# Patient Record
Sex: Male | Born: 1990
Health system: Southern US, Academic
[De-identification: ages and names within clinical notes are randomized; demographics above are authoritative.]

## PROBLEM LIST (undated history)

## (undated) ENCOUNTER — Encounter

## (undated) DIAGNOSIS — K219 Gastro-esophageal reflux disease without esophagitis: Secondary | ICD-10-CM

## (undated) DIAGNOSIS — F32A Depression, unspecified: Secondary | ICD-10-CM

## (undated) DIAGNOSIS — F329 Major depressive disorder, single episode, unspecified: Secondary | ICD-10-CM

## (undated) DIAGNOSIS — M549 Dorsalgia, unspecified: Secondary | ICD-10-CM

## (undated) DIAGNOSIS — Q860 Fetal alcohol syndrome (dysmorphic): Secondary | ICD-10-CM

## (undated) DIAGNOSIS — F319 Bipolar disorder, unspecified: Secondary | ICD-10-CM

## (undated) DIAGNOSIS — F930 Separation anxiety disorder of childhood: Secondary | ICD-10-CM

## (undated) DIAGNOSIS — F909 Attention-deficit hyperactivity disorder, unspecified type: Secondary | ICD-10-CM

## (undated) HISTORY — DX: Gastro-esophageal reflux disease without esophagitis: K21.9

## (undated) HISTORY — PX: DENTAL SURGERY: SHX609

## (undated) HISTORY — DX: Fetal alcohol syndrome (dysmorphic): Q86.0

---
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
## 2012-08-05 ENCOUNTER — Emergency Department (HOSPITAL_COMMUNITY)
Admission: EM | Admit: 2012-08-05 | Discharge: 2012-08-05 | Disposition: A | Discharge status: Home / Self Care | Payer: Medicaid Other | Attending: Emergency Medicine | Admitting: Emergency Medicine

## 2012-08-05 ENCOUNTER — Emergency Department (HOSPITAL_COMMUNITY)
Admission: EM | Admit: 2012-08-05 | Discharge: 2012-08-05 | Discharge status: Against Medical Advice | Payer: Medicaid Other

## 2012-08-05 ENCOUNTER — Encounter (HOSPITAL_COMMUNITY): Payer: Self-pay | Admitting: Emergency Medicine

## 2012-08-05 DIAGNOSIS — F319 Bipolar disorder, unspecified: Secondary | ICD-10-CM | POA: Insufficient documentation

## 2012-08-05 DIAGNOSIS — S335XXA Sprain of ligaments of lumbar spine, initial encounter: Secondary | ICD-10-CM | POA: Insufficient documentation

## 2012-08-05 DIAGNOSIS — Y9389 Activity, other specified: Secondary | ICD-10-CM | POA: Insufficient documentation

## 2012-08-05 DIAGNOSIS — F17200 Nicotine dependence, unspecified, uncomplicated: Secondary | ICD-10-CM | POA: Insufficient documentation

## 2012-08-05 DIAGNOSIS — S39012A Strain of muscle, fascia and tendon of lower back, initial encounter: Secondary | ICD-10-CM

## 2012-08-05 DIAGNOSIS — S239XXA Sprain of unspecified parts of thorax, initial encounter: Secondary | ICD-10-CM | POA: Insufficient documentation

## 2012-08-05 DIAGNOSIS — F909 Attention-deficit hyperactivity disorder, unspecified type: Secondary | ICD-10-CM | POA: Insufficient documentation

## 2012-08-05 DIAGNOSIS — F411 Generalized anxiety disorder: Secondary | ICD-10-CM | POA: Insufficient documentation

## 2012-08-05 DIAGNOSIS — X503XXA Overexertion from repetitive movements, initial encounter: Secondary | ICD-10-CM | POA: Insufficient documentation

## 2012-08-05 DIAGNOSIS — Z79899 Other long term (current) drug therapy: Secondary | ICD-10-CM | POA: Insufficient documentation

## 2012-08-05 DIAGNOSIS — Y929 Unspecified place or not applicable: Secondary | ICD-10-CM | POA: Insufficient documentation

## 2012-08-05 HISTORY — DX: Dorsalgia, unspecified: M54.9

## 2012-08-05 HISTORY — DX: Attention-deficit hyperactivity disorder, unspecified type: F90.9

## 2012-08-05 HISTORY — DX: Bipolar disorder, unspecified: F31.9

## 2012-08-05 HISTORY — DX: Separation anxiety disorder of childhood: F93.0

## 2012-08-05 MED ORDER — OXYCODONE-ACETAMINOPHEN 5-325 MG PO TABS: 1.0000 | ORAL_TABLET | ORAL | Status: DC | PRN

## 2012-08-05 MED ORDER — IBUPROFEN 400 MG PO TABS
600.0000 mg | ORAL_TABLET | Freq: Once | ORAL | Status: AC
  Administered 2012-08-05: 600 mg via ORAL
  Filled 2012-08-05: qty 2

## 2012-08-05 MED ORDER — IBUPROFEN 600 MG PO TABS: 600.0000 mg | ORAL_TABLET | Freq: Four times a day (QID) | ORAL | Status: DC | PRN

## 2012-08-05 NOTE — ED Notes (Signed)
Patient states he has had problems with his back, but for the past 2 days, the pain has increased.  Patient states pain is radiating up to his shoulder blades.

## 2012-08-08 NOTE — ED Provider Notes (Signed)
CSN: [id number]     Arrival date & time 08/05/12  19 History    22 year old male with back pain. Gradual onset about 2 days ago. Progressively worsening. Patient relates a past history of back problems. Patient has been doing a lot of heavy lifting recently while helping somebody move. Pain is primarily in his mid back. It extends up into her shoulder blades and down into his lumbar region. Worse with movement. No acute numbness, tingling or loss of strength. No urinary complaints. Has been ambulating without much difficulty although the pain is somewhat increased with walking. Has tried taking ibuprofen with minimal relief.   First MD Initiated Contact with Patient 08/05/12 0153     Chief Complaint  Patient presents with  . Back Pain   (Consider location/radiation/quality/duration/timing/severity/associated sxs/prior Treatment) HPI  Past Medical History  Diagnosis Date  . Back pain   . ADHD (attention deficit hyperactivity disorder)   . Bipolar 1 disorder   . Separation anxiety    History reviewed. No pertinent past surgical history. No family history on file. History  Substance Use Topics  . Smoking status: Current Every Day Smoker -- 2.00 packs/day    Types: Cigarettes  . Smokeless tobacco: Not on file  . Alcohol Use: No    Review of Systems  All systems reviewed and negative, other than as noted in HPI.   Allergies  Review of patient's allergies indicates no known allergies.  Home Medications   Current Outpatient Rx  Name  Route  Sig  Dispense  Refill  . ALPRAZolam (XANAX) 1 MG tablet   Oral   Take 1 mg by mouth 3 (three) times daily as needed.         Marland Kitchen amphetamine-dextroamphetamine (ADDERALL XR) 20 MG 24 hr capsule   Oral   Take 20 mg by mouth every morning.         Marland Kitchen ibuprofen (ADVIL,MOTRIN) 600 MG tablet   Oral   Take 1 tablet (600 mg total) by mouth every 6 (six) hours as needed for pain.   30 tablet   0   . oxyCODONE-acetaminophen  (PERCOCET/ROXICET) 5-325 MG per tablet   Oral   Take 1 tablet by mouth every 4 (four) hours as needed for pain.   8 tablet   0   . zolpidem (AMBIEN) 10 MG tablet   Oral   Take 10 mg by mouth at bedtime as needed. For sleep          BP 131/71  Pulse 100  Temp(Src) 98.5 F (36.9 C) (Oral)  Resp 20  Ht 6' (1.829 m)  Wt 185 lb (83.915 kg)  BMI 25.08 kg/m2  SpO2 98% Physical Exam  Nursing note and vitals reviewed. Constitutional: He appears well-developed and well-nourished. No distress.  HENT:  Head: Normocephalic and atraumatic.  Eyes: Conjunctivae are normal. Right eye exhibits no discharge. Left eye exhibits no discharge.  Neck: Neck supple.  Cardiovascular: Normal rate, regular rhythm and normal heart sounds.  Exam reveals no gallop and no friction rub.   No murmur heard. Pulmonary/Chest: Effort normal and breath sounds normal. No respiratory distress.  Abdominal: Soft. He exhibits no distension. There is no tenderness.  Musculoskeletal: He exhibits no edema and no tenderness.  Tenderness along the paraspinal muscles in the mid/lower thoracic region as well as the lumbar region. No concerning skin lesions noted. Strength is 5 out 5 bilateral lower extremities. Patellar reflexes are one plus bilaterally. Sensation is intact to light touch. He has palpable  DP pulses bilaterally.  Neurological: He is alert.  Skin: Skin is warm and dry.  Psychiatric: He has a normal mood and affect. His behavior is normal. Thought content normal.    ED Course   Procedures (including critical care time)  Labs Reviewed - No data to display No results found. 1. Back strain, initial encounter     MDM  22 year old male with likely back strain. Plan symptomatic treatment. Nonfocal neurological examination.  Raeford Razor, MD 08/08/12 [phone]

## 2012-08-27 ENCOUNTER — Encounter (HOSPITAL_COMMUNITY): Payer: Self-pay | Admitting: *Deleted

## 2012-08-27 ENCOUNTER — Emergency Department (HOSPITAL_COMMUNITY): Payer: Medicaid Other

## 2012-08-27 ENCOUNTER — Emergency Department (HOSPITAL_COMMUNITY)
Admission: EM | Admit: 2012-08-27 | Discharge: 2012-08-27 | Disposition: A | Discharge status: Home / Self Care | Payer: Medicaid Other | Attending: Emergency Medicine | Admitting: Emergency Medicine

## 2012-08-27 DIAGNOSIS — M545 Low back pain, unspecified: Secondary | ICD-10-CM | POA: Insufficient documentation

## 2012-08-27 DIAGNOSIS — Z79899 Other long term (current) drug therapy: Secondary | ICD-10-CM | POA: Insufficient documentation

## 2012-08-27 DIAGNOSIS — R109 Unspecified abdominal pain: Secondary | ICD-10-CM

## 2012-08-27 DIAGNOSIS — F17200 Nicotine dependence, unspecified, uncomplicated: Secondary | ICD-10-CM | POA: Insufficient documentation

## 2012-08-27 DIAGNOSIS — R1011 Right upper quadrant pain: Secondary | ICD-10-CM | POA: Insufficient documentation

## 2012-08-27 DIAGNOSIS — Z8659 Personal history of other mental and behavioral disorders: Secondary | ICD-10-CM | POA: Insufficient documentation

## 2012-08-27 LAB — CBC WITH DIFFERENTIAL/PLATELET
Basophils Absolute: 0.1 10*3/uL (ref 0.0–0.1)
Basophils Relative: 1 % (ref 0–1)
Eosinophils Relative: 2 % (ref 0–5)
Lymphocytes Relative: 29 % (ref 12–46)
MCHC: 34 g/dL (ref 30.0–36.0)
MCV: 92.1 fL (ref 78.0–100.0)
Monocytes Absolute: 0.4 10*3/uL (ref 0.1–1.0)
Neutro Abs: 3.6 10*3/uL (ref 1.7–7.7)
Platelets: 254 10*3/uL (ref 150–400)
RDW: 12.4 % (ref 11.5–15.5)
WBC: 5.9 10*3/uL (ref 4.0–10.5)

## 2012-08-27 LAB — URINALYSIS, ROUTINE W REFLEX MICROSCOPIC
Bilirubin Urine: NEGATIVE
Glucose, UA: NEGATIVE mg/dL
Nitrite: NEGATIVE
Specific Gravity, Urine: 1.025 (ref 1.005–1.030)
pH: 6 (ref 5.0–8.0)

## 2012-08-27 LAB — COMPREHENSIVE METABOLIC PANEL
ALT: 14 U/L (ref 0–53)
AST: 16 U/L (ref 0–37)
Albumin: 3.7 g/dL (ref 3.5–5.2)
CO2: 31 mEq/L (ref 19–32)
Calcium: 10.1 mg/dL (ref 8.4–10.5)
Creatinine, Ser: 0.71 mg/dL (ref 0.50–1.35)
GFR calc non Af Amer: >90 mL/min (ref >90)
Sodium: 141 mEq/L (ref 135–145)
Total Protein: 7.2 g/dL (ref 6.0–8.3)

## 2012-08-27 LAB — LIPASE, BLOOD: Lipase: 30 U/L (ref 11–59)

## 2012-08-27 MED ORDER — RANITIDINE HCL 150 MG PO CAPS: 150.0000 mg | ORAL_CAPSULE | Freq: Every day | ORAL | Status: DC

## 2012-08-27 MED ORDER — HYDROMORPHONE HCL PF 1 MG/ML IJ SOLN
1.0000 mg | Freq: Once | INTRAMUSCULAR | Status: AC
  Administered 2012-08-27: 1 mg via INTRAVENOUS
  Filled 2012-08-27: qty 1

## 2012-08-27 MED ORDER — IOHEXOL 300 MG/ML  SOLN
50.0000 mL | Freq: Once | INTRAMUSCULAR | Status: AC | PRN
  Administered 2012-08-27: 50 mL via ORAL

## 2012-08-27 MED ORDER — TRAMADOL HCL 50 MG PO TABS: 50.0000 mg | ORAL_TABLET | Freq: Four times a day (QID) | ORAL | Status: DC | PRN

## 2012-08-27 MED ORDER — ONDANSETRON HCL 4 MG/2ML IJ SOLN
4.0000 mg | Freq: Once | INTRAMUSCULAR | Status: AC
  Administered 2012-08-27: 4 mg via INTRAVENOUS
  Filled 2012-08-27: qty 2

## 2012-08-27 MED ORDER — IOHEXOL 300 MG/ML  SOLN
100.0000 mL | Freq: Once | INTRAMUSCULAR | Status: AC | PRN
  Administered 2012-08-27: 100 mL via INTRAVENOUS

## 2012-08-27 NOTE — ED Notes (Signed)
Patient discharged to home. Verbalized understanding of discharge instructions. E-signature pad froze and would not work to obtain [employer]. Computer shut down and attempted to obtain E-signature from another computer, system stated e-signature was view only and would not let me obtain e-signature.

## 2012-08-27 NOTE — ED Notes (Signed)
R back pain ongoing x 1.5 months ago.  Seen here for same.  R abdominal pain now. No n/v/d.  Last NBM 15 mins. Ago.  No dysuria or hematuria.
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
## 2012-08-27 NOTE — ED Notes (Signed)
Dr. Estell Harpin in room at this time talking with patient about results.

## 2012-08-29 ENCOUNTER — Encounter (HOSPITAL_COMMUNITY): Payer: Self-pay | Admitting: Emergency Medicine

## 2012-08-29 ENCOUNTER — Emergency Department (HOSPITAL_COMMUNITY): Payer: Medicaid Other

## 2012-08-29 ENCOUNTER — Emergency Department (HOSPITAL_COMMUNITY)
Admission: EM | Admit: 2012-08-29 | Discharge: 2012-08-29 | Disposition: A | Discharge status: Home / Self Care | Payer: Medicaid Other | Attending: Emergency Medicine | Admitting: Emergency Medicine

## 2012-08-29 DIAGNOSIS — Z79899 Other long term (current) drug therapy: Secondary | ICD-10-CM | POA: Insufficient documentation

## 2012-08-29 DIAGNOSIS — F17200 Nicotine dependence, unspecified, uncomplicated: Secondary | ICD-10-CM | POA: Insufficient documentation

## 2012-08-29 DIAGNOSIS — Z8659 Personal history of other mental and behavioral disorders: Secondary | ICD-10-CM | POA: Insufficient documentation

## 2012-08-29 DIAGNOSIS — R0789 Other chest pain: Secondary | ICD-10-CM

## 2012-08-29 DIAGNOSIS — M542 Cervicalgia: Secondary | ICD-10-CM

## 2012-08-29 DIAGNOSIS — S298XXA Other specified injuries of thorax, initial encounter: Secondary | ICD-10-CM | POA: Insufficient documentation

## 2012-08-29 DIAGNOSIS — S0993XA Unspecified injury of face, initial encounter: Secondary | ICD-10-CM | POA: Insufficient documentation

## 2012-08-29 DIAGNOSIS — F930 Separation anxiety disorder of childhood: Secondary | ICD-10-CM | POA: Insufficient documentation

## 2012-08-29 MED ORDER — IBUPROFEN 800 MG PO TABS
800.0000 mg | ORAL_TABLET | Freq: Once | ORAL | Status: AC
  Administered 2012-08-29: 800 mg via ORAL
  Filled 2012-08-29: qty 1
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
## 2013-05-05 DIAGNOSIS — K047 Periapical abscess without sinus: Secondary | ICD-10-CM | POA: Insufficient documentation

## 2013-05-05 DIAGNOSIS — K029 Dental caries, unspecified: Secondary | ICD-10-CM | POA: Insufficient documentation

## 2013-05-05 DIAGNOSIS — F17200 Nicotine dependence, unspecified, uncomplicated: Secondary | ICD-10-CM | POA: Insufficient documentation

## 2013-05-05 DIAGNOSIS — Z8659 Personal history of other mental and behavioral disorders: Secondary | ICD-10-CM | POA: Insufficient documentation

## 2013-05-05 DIAGNOSIS — R599 Enlarged lymph nodes, unspecified: Secondary | ICD-10-CM | POA: Insufficient documentation

## 2013-05-06 ENCOUNTER — Encounter (HOSPITAL_COMMUNITY): Payer: Self-pay | Admitting: Emergency Medicine

## 2013-05-06 ENCOUNTER — Emergency Department (HOSPITAL_COMMUNITY)
Admission: EM | Admit: 2013-05-06 | Discharge: 2013-05-06 | Disposition: A | Discharge status: Home / Self Care | Payer: Medicaid Other | Attending: Emergency Medicine | Admitting: Emergency Medicine

## 2013-05-06 DIAGNOSIS — K047 Periapical abscess without sinus: Secondary | ICD-10-CM

## 2013-05-06 DIAGNOSIS — K029 Dental caries, unspecified: Secondary | ICD-10-CM

## 2013-05-06 MED ORDER — OXYCODONE-ACETAMINOPHEN 5-325 MG PO TABS
1.0000 | ORAL_TABLET | Freq: Once | ORAL | Status: AC
  Administered 2013-05-06: 1 via ORAL
  Filled 2013-05-06: qty 1

## 2013-05-06 MED ORDER — AMOXICILLIN 500 MG PO CAPS: 500.0000 mg | ORAL_CAPSULE | Freq: Three times a day (TID) | ORAL | Status: DC

## 2013-05-06 MED ORDER — HYDROCODONE-ACETAMINOPHEN 5-325 MG PO TABS: 1.0000 | ORAL_TABLET | ORAL | Status: DC | PRN

## 2013-05-06 MED ORDER — AMOXICILLIN 250 MG PO CAPS
500.0000 mg | ORAL_CAPSULE | Freq: Once | ORAL | Status: AC
  Administered 2013-05-06: 500 mg via ORAL
  Filled 2013-05-06: qty 2

## 2013-05-06 MED ORDER — IBUPROFEN 800 MG PO TABS
800.0000 mg | ORAL_TABLET | Freq: Once | ORAL | Status: AC
  Administered 2013-05-06: 800 mg via ORAL
  Filled 2013-05-06: qty 1

## 2013-05-06 NOTE — ED Notes (Signed)
Pt reports dental pain lower right.  States that he is supposed to see a Education officer, communitydentist but is waiting on his medicaid card.

## 2013-05-06 NOTE — Discharge Instructions (Signed)
Take the medication as directed. Do not take the narcotic if you area driving as it will make you sleepy. See a dentist as soon as possible.

## 2013-05-06 NOTE — ED Provider Notes (Signed)
CSN: [id number]     Arrival date & time 05/05/13  2335 History   First MD Initiated Contact with Patient 05/06/13 0009     Chief Complaint  Patient presents with  . Dental Pain     (Consider location/radiation/quality/duration/timing/severity/associated sxs/prior Treatment) Patient is a 23 y.o. male presenting with tooth pain. The history is provided by the patient.  Dental Pain Location:  Lower Lower teeth location:  17/LL 3rd molar Quality:  Sharp, shooting, throbbing and constant Onset quality:  Gradual Duration:  3 months Timing:  Constant Progression:  Worsening Context: abscess   Relieved by:  Nothing Worsened by:  Cold food/drink, jaw movement and pressure Ineffective treatments:  None tried Associated symptoms: facial pain, facial swelling and gum swelling   Associated symptoms: no fever, no headaches and no oral bleeding     Past Medical History  Diagnosis Date  . Back pain   . ADHD (attention deficit hyperactivity disorder)   . Bipolar 1 disorder   . Separation anxiety    History reviewed. No pertinent past surgical history. History reviewed. No pertinent family history. History  Substance Use Topics  . Smoking status: Current Every Day Smoker -- 1.00 packs/day    Types: Cigarettes  . Smokeless tobacco: Not on file  . Alcohol Use: No    Review of Systems  Constitutional: Negative for fever and chills.  HENT: Positive for dental problem and facial swelling. Negative for sore throat and trouble swallowing.   Respiratory: Negative for shortness of breath.   Cardiovascular: Negative for chest pain.  Gastrointestinal: Negative for nausea, vomiting and abdominal pain.  Musculoskeletal: Negative for back pain.  Skin: Negative for rash.  Neurological: Negative for seizures, syncope and headaches.  Psychiatric/Behavioral: The patient is not nervous/anxious.       Allergies  Review of patient's allergies indicates no known allergies.  Home Medications    Prior to Admission medications   Medication Sig Start Date End Date Taking? Authorizing Provider  ranitidine (ZANTAC) 150 MG capsule Take 1 capsule (150 mg total) by mouth daily. 08/27/12   Benny LennertJoseph L Zammit, MD  traMADol (ULTRAM) 50 MG tablet Take 1 tablet (50 mg total) by mouth every 6 (six) hours as needed for pain. 08/27/12   Benny LennertJoseph L Zammit, MD   BP 131/80  Pulse 84  Temp(Src) 98.5 F (36.9 C) (Oral)  Resp 18  Ht 6\' 2"  (1.88 m)  Wt 171 lb (77.565 kg)  BMI 21.95 kg/m2  SpO2 99% Physical Exam  Nursing note and vitals reviewed. Constitutional: He is oriented to person, place, and time. He appears well-developed and well-nourished. No distress.  HENT:  Mouth/Throat: Uvula is midline, oropharynx is clear and moist and mucous membranes are normal. No oral lesions. No trismus in the jaw. Dental abscesses and dental caries present. No uvula swelling.    There is swelling, erythema and tenderness surrounding the third molar right lower. The tooth is decayed. Left side facial swelling and tenderness of the left jaw.   Eyes: Conjunctivae and EOM are normal.  Neck: Neck supple.  Cardiovascular: Normal rate.   Pulmonary/Chest: Effort normal.  Musculoskeletal: Normal range of motion.  Lymphadenopathy:    He has cervical adenopathy.  Neurological: He is alert and oriented to person, place, and time. No cranial nerve deficit.  Skin: Skin is warm and dry.  Psychiatric: He has a normal mood and affect. His behavior is normal.    ED Course  Procedures Amoxil 500 mg PO, Percocet 5/325 PO, Ibuprofen 800  mg PO  MDM  23 y.o. male with dental pain and abscess. Will treat with antibiotics and pain medication. He will follow up with a dentist. He has applied for Medicaid and accepted and the dentist has agreed to see him when he has his card. Stable for discharge without fever or signs of sepsis. Discussed with the patient and all questioned fully answered. He will return if any problems arise.     Medication List    TAKE these medications       amoxicillin 500 MG capsule  Commonly known as:  AMOXIL  Take 1 capsule (500 mg total) by mouth 3 (three) times daily.     HYDROcodone-acetaminophen 5-325 MG per tablet  Commonly known as:  NORCO/VICODIN  Take 1 tablet by mouth every 4 (four) hours as needed.      ASK your doctor about these medications       ranitidine 150 MG capsule  Commonly known as:  ZANTAC  Take 1 capsule (150 mg total) by mouth daily.     traMADol 50 MG tablet  Commonly known as:  ULTRAM  Take 1 tablet (50 mg total) by mouth every 6 (six) hours as needed for pain.           [facility] Orlene OchM Naiya Corral, [place]NP 05/06/13 [phone]

## 2013-05-06 NOTE — ED Provider Notes (Signed)
Medical screening examination/treatment/procedure(s) were performed by non-physician practitioner and as supervising physician I was immediately available for consultation/collaboration.   Dione Boozeavid Rolin Schult, MD 05/06/13 [phone]

## 2013-06-18 ENCOUNTER — Encounter (HOSPITAL_COMMUNITY): Payer: Self-pay | Admitting: Emergency Medicine

## 2013-06-18 ENCOUNTER — Emergency Department (HOSPITAL_COMMUNITY)
Admission: EM | Admit: 2013-06-18 | Discharge: 2013-06-18 | Disposition: A | Discharge status: Home / Self Care | Payer: Medicaid Other | Attending: Emergency Medicine | Admitting: Emergency Medicine

## 2013-06-18 DIAGNOSIS — F17200 Nicotine dependence, unspecified, uncomplicated: Secondary | ICD-10-CM | POA: Insufficient documentation

## 2013-06-18 DIAGNOSIS — K047 Periapical abscess without sinus: Secondary | ICD-10-CM | POA: Insufficient documentation

## 2013-06-18 DIAGNOSIS — K029 Dental caries, unspecified: Secondary | ICD-10-CM | POA: Insufficient documentation

## 2013-06-18 DIAGNOSIS — Z79899 Other long term (current) drug therapy: Secondary | ICD-10-CM | POA: Insufficient documentation

## 2013-06-18 DIAGNOSIS — Z792 Long term (current) use of antibiotics: Secondary | ICD-10-CM | POA: Insufficient documentation

## 2013-06-18 DIAGNOSIS — Z8659 Personal history of other mental and behavioral disorders: Secondary | ICD-10-CM | POA: Insufficient documentation

## 2013-06-18 MED ORDER — HYDROCODONE-ACETAMINOPHEN 5-325 MG PO TABS: 1.0000 | ORAL_TABLET | ORAL | Status: DC | PRN

## 2013-06-18 MED ORDER — AMOXICILLIN 500 MG PO CAPS: 500.0000 mg | ORAL_CAPSULE | Freq: Three times a day (TID) | ORAL | Status: DC

## 2013-06-18 NOTE — ED Provider Notes (Signed)
CSN: [id number]     Arrival date & time 06/18/13  2010 History   First MD Initiated Contact with Patient 06/18/13 2057     Chief Complaint  Patient presents with  . Dental Pain     (Consider location/radiation/quality/duration/timing/severity/associated sxs/prior Treatment) The history is provided by the patient.   John Saunders is a 23 y.o. male presenting with a 2 day history of dental pain and gingival swelling.   The patient has a history of decay and pain involving his bilateral lower 3rd molars which has recently started to cause increased  pain.  There has been no fevers, chills, nausea or vomiting, also no complaint of difficulty swallowing, although chewing makes pain worse.  [place] dentist in [place] and [place] [facility] [place] [place] [facility] [facility] [facility] [facility].  The patient has tried ibuprofen milligram without relief of symptoms.     Past Medical History  Diagnosis Date  . Back pain   . ADHD (attention deficit hyperactivity disorder)   . Bipolar 1 disorder   . Separation anxiety    History reviewed. No pertinent past surgical history. History reviewed. No pertinent family history. History  Substance Use Topics  . Smoking status: Current Every Day Smoker -- 1.00 packs/day    Types: Cigarettes  . Smokeless tobacco: Not on file  . Alcohol Use: No    Review of Systems  Constitutional: Negative for fever.  HENT: Positive for dental problem. Negative for facial swelling and sore throat.   Respiratory: Negative for shortness of breath.   Musculoskeletal: Negative for neck pain and neck stiffness.      Allergies  Review of patient's allergies indicates no known allergies.  Home Medications   Prior to Admission medications   Medication Sig Start Date End Date Taking? Authorizing Aleric Froelich  amoxicillin (AMOXIL) 500 MG capsule Take 1 capsule (500 mg total) by mouth 3 (three) times daily. 05/06/13   Dione Boozeavid Glick, MD   HYDROcodone-acetaminophen (NORCO/VICODIN) 5-325 MG per tablet Take 1 tablet by mouth every 4 (four) hours as needed. 05/06/13   Dione Boozeavid Glick, MD  ranitidine (ZANTAC) 150 MG capsule Take 1 capsule (150 mg total) by mouth daily. 08/27/12   Benny LennertJoseph L Zammit, MD  traMADol (ULTRAM) 50 MG tablet Take 1 tablet (50 mg total) by mouth every 6 (six) hours as needed for pain. 08/27/12   Benny LennertJoseph L Zammit, MD   BP 156/76  Pulse 65  Temp(Src) 98.2 F (36.8 C) (Oral)  Resp 24  Ht 6\' 2"  (1.88 m)  Wt 175 lb (79.379 kg)  BMI 22.46 kg/m2  SpO2 100% Physical Exam  Constitutional: He is oriented to person, place, and time. He appears well-developed and well-nourished. No distress.  HENT:  Head: Normocephalic and atraumatic.  Right Ear: Tympanic membrane and external ear normal.  Left Ear: Tympanic membrane and external ear normal.  Mouth/Throat: Oropharynx is clear and moist and mucous membranes are normal. No oral lesions. No trismus in the jaw. Dental abscesses and dental caries present.  Decay found in the occlusal surfaces of both lower third molar teeth.  His right lower molar also has surrounding gingival edema and erythema.  There is no fluctuance.  There is no facial edema.  Eyes: Conjunctivae are normal.  Neck: Normal range of motion. Neck supple.  Cardiovascular: Normal rate and normal heart sounds.   Pulmonary/Chest: Effort normal.  Abdominal: He exhibits no distension.  Musculoskeletal: Normal range of motion.  Lymphadenopathy:  Head (right side): Submandibular adenopathy present.    He has no cervical adenopathy.  Neurological: He is alert and oriented to person, place, and time.  Skin: Skin is warm and dry. No erythema.  Psychiatric: He has a normal mood and affect.    ED Course  Procedures (including critical care time) Labs Review Labs Reviewed - No data to display  Imaging Review No results found.   EKG Interpretation None      MDM   Final diagnoses:  Dental abscess     Patient will be prescribed amoxicillin.  Hydrocodone.  Ibuprofen 600 mg tablets.  Strongly encouraged patient to keep his appointment with Dr. Lovell SheehanJenkins on July 3.    Burgess AmorJulie Idol, PA-C 06/18/13 2110

## 2013-06-18 NOTE — Discharge Instructions (Signed)
Abscessed Tooth An abscessed tooth is an infection around your tooth. It may be caused by holes or damage to the tooth (cavity) or a dental disease. An abscessed tooth causes mild to very bad pain in and around the tooth. See your dentist right away if you have tooth or gum pain. HOME CARE  Take your medicine as told. Finish it even if you start to feel better.  Do not drive after taking pain medicine.  Rinse your mouth (gargle) often with salt water ( teaspoon salt in 8 ounces of warm water).  Do not apply heat to the outside of your face. GET HELP RIGHT AWAY IF:   You have a temperature by mouth above 102 F (38.9 C), not controlled by medicine.  You have chills and a very bad headache.  You have problems breathing or swallowing.  Your mouth will not open.  You develop puffiness (swelling) on the neck or around the eye.  Your pain is not helped by medicine.  Your pain is getting worse instead of better. MAKE SURE YOU:   Understand these instructions.  Will watch your condition.  Will get help right away if you are not doing well or get worse. Document Released: 06/07/2007 Document Revised: 03/13/2011 Document Reviewed: 03/29/2010 [facility] Patient Information 2015 [employer] [place] [place]. This information is not intended to replace advice given to you by your health care provider. Make sure you discuss any questions you have with your health care provider.    Complete your entire course of antibiotics as prescribed.  You  may use the hydrocodone for pain relief but do not drive within 4 hours of taking as this will make you drowsy.  Avoid applying heat or ice to this abscess area which can worsen your symptoms.  You may use warm salt water swish and spit treatment or half peroxide and water swish and spit after meals to keep this area clean as discussed.  Call the dentist listed above for further management of your symptoms.

## 2013-06-18 NOTE — ED Notes (Signed)
Patient reports dental pain x 2 days.

## 2013-06-19 NOTE — ED Provider Notes (Signed)
Medical screening examination/treatment/procedure(s) were performed by non-physician practitioner and as supervising physician I was immediately available for consultation/collaboration.   EKG Interpretation None        Stephen Rancour, MD 06/19/13 0045 

## 2013-07-23 ENCOUNTER — Encounter (HOSPITAL_COMMUNITY): Payer: Self-pay | Admitting: Emergency Medicine

## 2013-07-23 ENCOUNTER — Emergency Department (HOSPITAL_COMMUNITY)
Admission: EM | Admit: 2013-07-23 | Discharge: 2013-07-23 | Disposition: A | Discharge status: Home / Self Care | Payer: Medicaid Other | Attending: Emergency Medicine | Admitting: Emergency Medicine

## 2013-07-23 DIAGNOSIS — F17200 Nicotine dependence, unspecified, uncomplicated: Secondary | ICD-10-CM | POA: Insufficient documentation

## 2013-07-23 DIAGNOSIS — F930 Separation anxiety disorder of childhood: Secondary | ICD-10-CM | POA: Insufficient documentation

## 2013-07-23 DIAGNOSIS — Z791 Long term (current) use of non-steroidal anti-inflammatories (NSAID): Secondary | ICD-10-CM | POA: Insufficient documentation

## 2013-07-23 DIAGNOSIS — K029 Dental caries, unspecified: Secondary | ICD-10-CM | POA: Insufficient documentation

## 2013-07-23 DIAGNOSIS — K047 Periapical abscess without sinus: Secondary | ICD-10-CM

## 2013-07-23 DIAGNOSIS — K089 Disorder of teeth and supporting structures, unspecified: Secondary | ICD-10-CM | POA: Diagnosis present

## 2013-07-23 DIAGNOSIS — K044 Acute apical periodontitis of pulpal origin: Secondary | ICD-10-CM | POA: Insufficient documentation

## 2013-07-23 MED ORDER — TRAMADOL HCL 50 MG PO TABS: 50.0000 mg | ORAL_TABLET | Freq: Four times a day (QID) | ORAL | Status: DC | PRN

## 2013-07-23 MED ORDER — TRAMADOL HCL 50 MG PO TABS
50.0000 mg | ORAL_TABLET | Freq: Once | ORAL | Status: AC
  Administered 2013-07-23: 50 mg via ORAL
  Filled 2013-07-23: qty 1

## 2013-07-23 MED ORDER — AMOXICILLIN 500 MG PO CAPS: 500.0000 mg | ORAL_CAPSULE | Freq: Three times a day (TID) | ORAL | Status: AC

## 2013-07-23 MED ORDER — AMOXICILLIN 250 MG PO CAPS
500.0000 mg | ORAL_CAPSULE | Freq: Once | ORAL | Status: AC
  Administered 2013-07-23: 500 mg via ORAL
  Filled 2013-07-23: qty 2

## 2013-07-23 NOTE — ED Notes (Signed)
Dental pain left side

## 2013-07-23 NOTE — ED Provider Notes (Signed)
Medical screening examination/treatment/procedure(s) were performed by non-physician practitioner and as supervising physician I was immediately available for consultation/collaboration.   EKG Interpretation None        Vanetta MuldersScott Deylan Canterbury, MD 07/23/13 [phone]

## 2013-07-23 NOTE — ED Provider Notes (Signed)
CSN: [id number]     Arrival date & time 07/23/13  1307 History   First MD Initiated Contact with Patient 07/23/13 1423     Chief Complaint  Patient presents with  . Dental Pain     (Consider location/radiation/quality/duration/timing/severity/associated sxs/prior Treatment) The history is provided by the patient.    John Saunders is a 23 y.o. male presenting with persistent dental pain in his 4 wisdom teeth with the lower hurting more than the upper teeth.  He was originally scheduled for dental extraction on July 3 with Dr. Barbette Merino which had to be rescheduled for August 17.  He reports worsened pain and gingival swelling for the past several days.  There has been no fevers, chills, nausea or vomiting, also no complaint of difficulty swallowing, although chewing makes pain worse.  The patient has tried tylenol and motrin without relief of symptoms.         Past Medical History  Diagnosis Date  . Back pain   . ADHD (attention deficit hyperactivity disorder)   . Bipolar 1 disorder   . Separation anxiety    History reviewed. No pertinent past surgical history. No family history on file. History  Substance Use Topics  . Smoking status: Current Every Day Smoker -- 1.00 packs/day    Types: Cigarettes  . Smokeless tobacco: Not on file  . Alcohol Use: No    Review of Systems  Constitutional: Negative for fever.  HENT: Positive for dental problem. Negative for facial swelling and sore throat.   Respiratory: Negative for shortness of breath.   Musculoskeletal: Negative for neck pain and neck stiffness.      Allergies  Review of patient's allergies indicates no known allergies.  Home Medications   Prior to Admission medications   Medication Sig Start Date End Date Taking? Authorizing Provider  ibuprofen (ADVIL,MOTRIN) 200 MG tablet Take 400 mg by mouth every 6 (six) hours as needed. pain   Yes Historical Provider, MD  amoxicillin (AMOXIL) 500 MG capsule Take 1 capsule (500 mg  total) by mouth 3 (three) times daily. 07/23/13 08/02/13  Burgess Amor, PA-C  traMADol (ULTRAM) 50 MG tablet Take 1 tablet (50 mg total) by mouth every 6 (six) hours as needed. 07/23/13   Burgess Amor, PA-C   BP 128/84  Pulse 96  Temp(Src) 98.7 F (37.1 C) (Oral)  Resp 18  Ht 6\' 2"  (1.88 m)  Wt 175 lb (79.379 kg)  BMI 22.46 kg/m2  SpO2 98% Physical Exam  Constitutional: He is oriented to person, place, and time. He appears well-developed and well-nourished. No distress.  HENT:  Head: Normocephalic and atraumatic.  Right Ear: Tympanic membrane and external ear normal.  Left Ear: Tympanic membrane and external ear normal.  Mouth/Throat: Oropharynx is clear and moist and mucous membranes are normal. No oral lesions. Abnormal dentition. Dental caries present. No dental abscesses.    Gingival edema bilateral lower third molars.  No fluctuance or evidence of abscess.  Deep cavity noted in right lower third molar  Eyes: Conjunctivae are normal.  Neck: Normal range of motion. Neck supple.  Cardiovascular: Normal rate and normal heart sounds.   Pulmonary/Chest: Effort normal.  Abdominal: He exhibits no distension.  Musculoskeletal: Normal range of motion.  Lymphadenopathy:    He has no cervical adenopathy.  Neurological: He is alert and oriented to person, place, and time.  Skin: Skin is warm and dry. No erythema.  Psychiatric: He has a normal mood and affect.    ED Course  Procedures (including critical care time) Labs Review Labs Reviewed - No data to display  Imaging Review No results found.   EKG Interpretation None      MDM   Final diagnoses:  Dental infection    Patient does appear to have early dental infection without obvious abscess identified.  He was prescribed amoxicillin and Ultram.  He was encouraged to keep his appointment with Dr. Barbette MerinoJensen on August 17 as he has already scheduled for wisdom tooth extraction.    Burgess AmorJulie Greenly Rarick, PA-C 07/23/13 1443

## 2013-07-23 NOTE — ED Notes (Signed)
Pt c/o left side dental pain x2 days. Denies drainage/bleeding.

## 2013-07-23 NOTE — Discharge Instructions (Signed)

## 2014-12-02 ENCOUNTER — Emergency Department (HOSPITAL_COMMUNITY)
Admission: EM | Admit: 2014-12-02 | Discharge: 2014-12-03 | Disposition: A | Discharge status: Home / Self Care | Payer: Medicaid Other | Attending: Emergency Medicine | Admitting: Emergency Medicine

## 2014-12-02 ENCOUNTER — Encounter (HOSPITAL_COMMUNITY): Payer: Self-pay | Admitting: Emergency Medicine

## 2014-12-02 DIAGNOSIS — Z8659 Personal history of other mental and behavioral disorders: Secondary | ICD-10-CM | POA: Insufficient documentation

## 2014-12-02 DIAGNOSIS — R112 Nausea with vomiting, unspecified: Secondary | ICD-10-CM | POA: Insufficient documentation

## 2014-12-02 DIAGNOSIS — R1013 Epigastric pain: Secondary | ICD-10-CM | POA: Diagnosis not present

## 2014-12-02 DIAGNOSIS — Z79899 Other long term (current) drug therapy: Secondary | ICD-10-CM | POA: Insufficient documentation

## 2014-12-02 DIAGNOSIS — F17210 Nicotine dependence, cigarettes, uncomplicated: Secondary | ICD-10-CM | POA: Diagnosis not present

## 2014-12-02 DIAGNOSIS — R1033 Periumbilical pain: Secondary | ICD-10-CM | POA: Insufficient documentation

## 2014-12-02 NOTE — ED Notes (Signed)
Pt c/o severe sharp abd pain x 10 minutes. Pt denies any n/v/d.

## 2014-12-03 ENCOUNTER — Emergency Department (HOSPITAL_COMMUNITY): Payer: Medicaid Other

## 2014-12-03 LAB — COMPREHENSIVE METABOLIC PANEL
ALBUMIN: 4 g/dL (ref 3.5–5.0)
ALK PHOS: 62 U/L (ref 38–126)
ALT: 15 U/L — ABNORMAL LOW (ref 17–63)
ANION GAP: 10 (ref 5–15)
AST: 16 U/L (ref 15–41)
BILIRUBIN TOTAL: 0.7 mg/dL (ref 0.3–1.2)
BUN: 12 mg/dL (ref 6–20)
CALCIUM: 9.6 mg/dL (ref 8.9–10.3)
CO2: 27 mmol/L (ref 22–32)
Chloride: 103 mmol/L (ref 101–111)
Creatinine, Ser: 0.88 mg/dL (ref 0.61–1.24)
GFR calc Af Amer: >60 mL/min (ref >60)
GFR calc non Af Amer: >60 mL/min (ref >60)
GLUCOSE: 110 mg/dL — AB (ref 65–99)
POTASSIUM: 3.4 mmol/L — AB (ref 3.5–5.1)
SODIUM: 140 mmol/L (ref 135–145)
TOTAL PROTEIN: 7.2 g/dL (ref 6.5–8.1)

## 2014-12-03 LAB — CBC WITH DIFFERENTIAL/PLATELET
Basophils Absolute: 0.1 10*3/uL (ref 0.0–0.1)
Basophils Relative: 1 %
EOS PCT: 8 %
Eosinophils Absolute: 0.7 10*3/uL (ref 0.0–0.7)
HEMATOCRIT: 41.9 % (ref 39.0–52.0)
Hemoglobin: 14.7 g/dL (ref 13.0–17.0)
LYMPHS PCT: 25 %
Lymphs Abs: 2.3 10*3/uL (ref 0.7–4.0)
MCH: 31.6 pg (ref 26.0–34.0)
MCHC: 35.1 g/dL (ref 30.0–36.0)
MCV: 90.1 fL (ref 78.0–100.0)
MONO ABS: 0.7 10*3/uL (ref 0.1–1.0)
MONOS PCT: 7 %
NEUTROS ABS: 5.3 10*3/uL (ref 1.7–7.7)
Neutrophils Relative %: 59 %
PLATELETS: 276 10*3/uL (ref 150–400)
RBC: 4.65 MIL/uL (ref 4.22–5.81)
RDW: 12.5 % (ref 11.5–15.5)
WBC: 9.1 10*3/uL (ref 4.0–10.5)

## 2014-12-03 LAB — URINALYSIS, ROUTINE W REFLEX MICROSCOPIC
Bilirubin Urine: NEGATIVE
GLUCOSE, UA: NEGATIVE mg/dL
Hgb urine dipstick: NEGATIVE
KETONES UR: NEGATIVE mg/dL
Leukocytes, UA: NEGATIVE
Nitrite: NEGATIVE
PH: 6 (ref 5.0–8.0)
PROTEIN: NEGATIVE mg/dL
Specific Gravity, Urine: 1.025 (ref 1.005–1.030)

## 2014-12-03 MED ORDER — IOHEXOL 300 MG/ML  SOLN
100.0000 mL | Freq: Once | INTRAMUSCULAR | Status: AC | PRN
  Administered 2014-12-03: 100 mL via INTRAVENOUS

## 2014-12-03 MED ORDER — ONDANSETRON HCL 4 MG/2ML IJ SOLN
4.0000 mg | Freq: Once | INTRAMUSCULAR | Status: AC
  Administered 2014-12-03: 4 mg via INTRAVENOUS
  Filled 2014-12-03: qty 2

## 2014-12-03 MED ORDER — FENTANYL CITRATE (PF) 100 MCG/2ML IJ SOLN
50.0000 ug | Freq: Once | INTRAMUSCULAR | Status: AC
  Administered 2014-12-03: 50 ug via INTRAVENOUS
  Filled 2014-12-03: qty 2

## 2014-12-03 MED ORDER — SODIUM CHLORIDE 0.9 % IV SOLN: 1000.0000 mL | INTRAVENOUS | Status: DC

## 2014-12-03 MED ORDER — BARIUM SULFATE 2 % PO SUSP
450.0000 mL | Freq: Once | ORAL | Status: DC
  Filled 2014-12-03: qty 450

## 2014-12-03 MED ORDER — SODIUM CHLORIDE 0.9 % IV SOLN
1000.0000 mL | Freq: Once | INTRAVENOUS | Status: AC
  Administered 2014-12-03: 1000 mL via INTRAVENOUS

## 2014-12-03 MED ORDER — OMEPRAZOLE 20 MG PO CPDR: DELAYED_RELEASE_CAPSULE | ORAL | Status: DC

## 2014-12-03 MED ORDER — DICYCLOMINE HCL 20 MG PO TABS: 20.0000 mg | ORAL_TABLET | Freq: Three times a day (TID) | ORAL | Status: DC

## 2014-12-03 NOTE — ED Provider Notes (Signed)
CSN: [id number]   Arrival date & time 12/02/14 2343  History  By signing my name below, I, John Saunders, attest that this documentation has been prepared under the direction and in the presence of John Albe, MD at 0030. Electronically Signed: Bethel Saunders, ED Scribe. 12/03/2014. 3:25 AM.  Chief Complaint  Patient presents with  . Abdominal Pain    HPI The history is provided by the patient. No language interpreter was used.   John Saunders is a 24 y.o. male who presents to the Emergency Department complaining of recurrent, sharp, constant, non-radiating  periumbilical abdominal pain with sudden onset 10 minutes PTA while laying down. Applying pressure to the abdomen variably improves the pain and nothing makes it worse. The pain started 2 hours after eating spaghetti.  He had 2 similar episodes (last week and the week before) that lasted for 1 hour and resolved spontaneously. He did not seek medical attention the first 2 times.  Associated symptoms include nausea and 2 episodes of emesis tonight. Pt denies diarrhea, dysuria, and hematuria.    PCP [facility] Department   Past Medical History  Diagnosis Date  . Back pain   . ADHD (attention deficit hyperactivity disorder)   . Bipolar 1 disorder (HCC)   . Separation anxiety     History reviewed. No pertinent past surgical history.  History reviewed. No pertinent family history.  Social History  Substance Use Topics  . Smoking status: Current Every Day Smoker -- 1.00 packs/day    Types: Cigarettes  . Smokeless tobacco: None  . Alcohol Use: No   on disability for ADHD, bipolar and fetal alcohol syndrome  Review of Systems  Gastrointestinal: Positive for nausea, vomiting and abdominal pain. Negative for diarrhea.  Genitourinary: Negative for dysuria and hematuria.  All other systems reviewed and are negative.  Home Medications   Prior to Admission medications   Medication Sig Start Date End Date Taking?  Authorizing Provider  dicyclomine (BENTYL) 20 MG tablet Take 1 tablet (20 mg total) by mouth 4 (four) times daily -  before meals and at bedtime. 12/03/14   John Albe, MD  ibuprofen (ADVIL,MOTRIN) 200 MG tablet Take 400 mg by mouth every 6 (six) hours as needed. pain    Historical Provider, MD  omeprazole (PRILOSEC) 20 MG capsule Take 1 po BID x 2 weeks then once a day 12/03/14   John Albe, MD  traMADol (ULTRAM) 50 MG tablet Take 1 tablet (50 mg total) by mouth every 6 (six) hours as needed. 07/23/13   Burgess Amor, PA-C    Allergies  Review of patient's allergies indicates no known allergies.  Triage Vitals: BP 135/77 mmHg  Pulse 73  Temp(Src) 98.2 F (36.8 C)  Resp 20  Ht  (1.88 m)  Wt 175 lb (79.379 kg)  BMI 22.46 kg/m2  SpO2 95%  Vital signs normal    Physical Exam  Constitutional: He is oriented to person, place, and time. He appears well-developed and well-nourished.  Non-toxic appearance. He does not appear ill. He appears distressed.  Tearful  HENT:  Head: Normocephalic and atraumatic.  Right Ear: External ear normal.  Left Ear: External ear normal.  Nose: Nose normal. No mucosal edema or rhinorrhea.  Mouth/Throat: Oropharynx is clear and moist and mucous membranes are normal. No dental abscesses or uvula swelling.  Eyes: Conjunctivae and EOM are normal. Pupils are equal, round, and reactive to light.  Neck: Normal range of motion and full passive range of motion without  pain. Neck supple.  Cardiovascular: Normal rate, regular rhythm and normal heart sounds.  Exam reveals no gallop and no friction rub.   No murmur heard. Pulmonary/Chest: Effort normal and breath sounds normal. No respiratory distress. He has no wheezes. He has no rhonchi. He has no rales. He exhibits no tenderness and no crepitus.  Abdominal: Soft. Normal appearance and bowel sounds are normal. He exhibits no distension. There is tenderness in the right upper quadrant, epigastric area, periumbilical area  and suprapubic area. There is no rebound and no guarding.    Tenderness is worse at periumbilical region  Musculoskeletal: Normal range of motion. He exhibits no edema or tenderness.  Moves all extremities well.   Neurological: He is alert and oriented to person, place, and time. He has normal strength. No cranial nerve deficit.  Skin: Skin is warm, dry and intact. No rash noted. No erythema. No pallor.  Psychiatric: His speech is normal and behavior is normal.  Nursing note and vitals reviewed.   ED Course  Procedures Medications  0.9 %  sodium chloride infusion (0 mLs Intravenous Stopped 12/03/14 0226)    Followed by  0.9 %  sodium chloride infusion (not administered)  barium (READI-CAT 2) 2 % suspension 450 mL (not administered)  fentaNYL (SUBLIMAZE) injection 50 mcg (50 mcg Intravenous Given 12/03/14 0102)  ondansetron (ZOFRAN) injection 4 mg (4 mg Intravenous Given 12/03/14 0102)  iohexol (OMNIPAQUE) 300 MG/ML solution 100 mL (100 mLs Intravenous Contrast Given 12/03/14 0203)     DIAGNOSTIC STUDIES: Oxygen Saturation is 95% on RA, normal by my interpretation.    COORDINATION OF CARE: 12:39 AM Discussed treatment plan which includes lab work, pain management, and CT A/P with pt at bedside and pt agreed to plan. When patient first gave his history of suspicious he may be having some intermittent intussusception as etiology of his pain. CT scan was ordered.  Recheck at time of discharge. Patient is sleeping comfortably and states his pain is gone. We discussed his CT results. He was started on omeprazole and prescribed Bentyl to use if his pain returns. He is to follow-up with gastroenterology, Dr. Darrick PennaFields for further evaluation of his abdominal pain.    Labs Reviewed   Results for orders placed or performed during the hospital encounter of 12/02/14  Comprehensive metabolic panel  Result Value Ref Range   Sodium 140 135 - 145 mmol/L   Potassium 3.4 (L) 3.5 - 5.1 mmol/L    Chloride 103 101 - 111 mmol/L   CO2 27 22 - 32 mmol/L   Glucose, Bld 110 (H) 65 - 99 mg/dL   BUN 12 6 - 20 mg/dL   Creatinine, Ser 1.610.88 0.61 - 1.24 mg/dL   Calcium 9.6 8.9 - 09.610.3 mg/dL   Total Protein 7.2 6.5 - 8.1 g/dL   Albumin 4.0 3.5 - 5.0 g/dL   AST 16 15 - 41 U/L   ALT 15 (L) 17 - 63 U/L   Alkaline Phosphatase 62 38 - 126 U/L   Total Bilirubin 0.7 0.3 - 1.2 mg/dL   GFR calc non Af Amer >60 >60 mL/min   GFR calc Af Amer >60 >60 mL/min   Anion gap 10 5 - 15  CBC with Differential  Result Value Ref Range   WBC 9.1 4.0 - 10.5 K/uL   RBC 4.65 4.22 - 5.81 MIL/uL   Hemoglobin 14.7 13.0 - 17.0 g/dL   HCT 04.541.9 40.939.0 - 81.152.0 %   MCV 90.1 78.0 - 100.0 fL  MCH 31.6 26.0 - 34.0 pg   MCHC 35.1 30.0 - 36.0 g/dL   RDW 16.1 09.6 - 04.5 %   Platelets 276 150 - 400 K/uL   Neutrophils Relative % 59 %   Neutro Abs 5.3 1.7 - 7.7 K/uL   Lymphocytes Relative 25 %   Lymphs Abs 2.3 0.7 - 4.0 K/uL   Monocytes Relative 7 %   Monocytes Absolute 0.7 0.1 - 1.0 K/uL   Eosinophils Relative 8 %   Eosinophils Absolute 0.7 0.0 - 0.7 K/uL   Basophils Relative 1 %   Basophils Absolute 0.1 0.0 - 0.1 K/uL  Urinalysis, Routine w reflex microscopic  Result Value Ref Range   Color, Urine YELLOW YELLOW   APPearance CLEAR CLEAR   Specific Gravity, Urine 1.025 1.005 - 1.030   pH 6.0 5.0 - 8.0   Glucose, UA NEGATIVE NEGATIVE mg/dL   Hgb urine dipstick NEGATIVE NEGATIVE   Bilirubin Urine NEGATIVE NEGATIVE   Ketones, ur NEGATIVE NEGATIVE mg/dL   Protein, ur NEGATIVE NEGATIVE mg/dL   Nitrite NEGATIVE NEGATIVE   Leukocytes, UA NEGATIVE NEGATIVE   Laboratory interpretation all normal except mild hypokalemia     Imaging Review Ct Abdomen Pelvis W Contrast  12/03/2014  CLINICAL DATA:  24 year old male with recurrent sharp periumbilical abdominal pain EXAM: CT ABDOMEN AND PELVIS WITH CONTRAST TECHNIQUE: Multidetector CT imaging of the abdomen and pelvis was performed using the standard protocol following bolus  administration of intravenous contrast. CONTRAST:  OMNIPAQUE IOHEXOL 300 MG/ML  SOLN COMPARISON:  CT dated 08/27/2012 FINDINGS: The visualized lung bases are clear. No intra-abdominal free air or free fluid. The liver, gallbladder, pancreas, spleen, adrenal glands, kidneys, visualized ureters, and urinary bladder appear unremarkable. The prostate and seminal vesicles are grossly unremarkable. The stomach is distended with oral content. There is no evidence of gastric outlet obstruction. There is moderate stool throughout the colon. No evidence of bowel obstruction or inflammation. Normal appendix. The abdominal aorta and IVC appear unremarkable. No portal venous gas identified. There is no lymphadenopathy. There is exaggerated thoracic kyphosis, similar prior study. There is L3 limbus vertebrae. No acute osseous pathology identified. IMPRESSION: No acute intra-abdominal or pelvic pathology. Electronically Signed   By: Elgie Collard M.D.   On: 12/03/2014 02:18    I personally reviewed and evaluated these images and lab results as a part of my medical decision-making.    MDM   Final diagnoses:  Periumbilical abdominal pain    New Prescriptions   DICYCLOMINE (BENTYL) 20 MG TABLET    Take 1 tablet (20 mg total) by mouth 4 (four) times daily -  before meals and at bedtime.   OMEPRAZOLE (PRILOSEC) 20 MG CAPSULE    Take 1 po BID x 2 weeks then once a day    Plan discharge  John Albe, MD, FACEP  I personally performed the services described in this documentation, which was scribed in my presence. The recorded information has been reviewed and considered.  John Albe, MD, Concha Pyo, MD 12/03/14 [phone]

## 2014-12-03 NOTE — Discharge Instructions (Signed)
Avoid fried, spicy, or greasy foods. Take the omeprazole as prescribed. Use the Bentyl or dicyclomine if your pain returns. Call Dr. Darrick PennaFields office to get an appointment for further evaluation of your abdominal pain. She is a gastroenterologist or stomach specialist. Recheck if you seem to be getting worse instead of better.

## 2014-12-18 ENCOUNTER — Telehealth: Payer: Self-pay | Admitting: Nurse Practitioner

## 2014-12-18 NOTE — Telephone Encounter (Signed)
Pt is aware of OV for 12/22 at 9 with AS

## 2014-12-18 NOTE — Telephone Encounter (Signed)
No previous GI in the system. Ok to schedule. 

## 2014-12-18 NOTE — Telephone Encounter (Signed)
Patient was seen in the ED and was referred to follow up with us. He will be a new patient. Please advise if we can accept him as a new patient.

## 2014-12-24 ENCOUNTER — Other Ambulatory Visit: Payer: Self-pay

## 2014-12-24 ENCOUNTER — Encounter: Payer: Self-pay | Admitting: Gastroenterology

## 2014-12-24 ENCOUNTER — Ambulatory Visit (INDEPENDENT_AMBULATORY_CARE_PROVIDER_SITE_OTHER): Payer: Medicaid Other | Admitting: Gastroenterology

## 2014-12-24 VITALS — BP 134/80 | HR 96 | Temp 97.0°F | Ht 74.0 in | Wt 201.4 lb

## 2014-12-24 DIAGNOSIS — K920 Hematemesis: Secondary | ICD-10-CM | POA: Diagnosis not present

## 2014-12-24 DIAGNOSIS — R110 Nausea: Secondary | ICD-10-CM

## 2014-12-24 MED ORDER — PANTOPRAZOLE SODIUM 40 MG PO TBEC: 40.0000 mg | DELAYED_RELEASE_TABLET | Freq: Every day | ORAL | Status: DC

## 2014-12-24 MED ORDER — SUCRALFATE 1 GM/10ML PO SUSP: 1.0000 g | Freq: Three times a day (TID) | ORAL | Status: DC

## 2014-12-24 NOTE — Progress Notes (Signed)
NO PCP PER PATIENT °

## 2014-12-24 NOTE — Progress Notes (Signed)
Primary Care Physician:  No PCP Per Patient Primary Gastroenterologist:  Dr. Jena Gaussourk   Chief Complaint  Patient presents with  . Follow-up    ER pt  . Abdominal Pain    HPI:   John Saunders is a 24 y.o. male presenting today at the request of the ED secondary to abdominal pain. CT on file from early Dec 2016 without any acute abnormalities. Labs unrevealing.   Noting sharp pains, intermittently. Has happened twice before and vomited blood. Periumbilical pain. Present for over a month. Comes out of nowhere. Worse when stressed or anxious. Sometimes will shoot around to the left side. One episode of hematemesis was burgundy, one was dark. No reflux. No dysphagia. Can drink a glass of milk and it calms down for a little while. Will have loss of appetite when pain hits. Gallbladder remains in situ. If eats tacos, hamburgers, hot dogs, will upset stomach. Will get so bad he is on his knees. Taking Ibuprofen/Aleve intermittently. Taking Bentyl from the ED but without improvement. Prilosec once a day now without improvement. No ETOH use.    Past Medical History  Diagnosis Date  . Back pain   . ADHD (attention deficit hyperactivity disorder)   . Bipolar 1 disorder (HCC)   . Separation anxiety   . Fetal alcohol syndrome     Past Surgical History  Procedure Laterality Date  . None      Current Outpatient Prescriptions  Medication Sig Dispense Refill  . dicyclomine (BENTYL) 20 MG tablet Take 1 tablet (20 mg total) by mouth 4 (four) times daily -  before meals and at bedtime. 60 tablet 0  . ibuprofen (ADVIL,MOTRIN) 200 MG tablet Take 400 mg by mouth every 6 (six) hours as needed. pain    . omeprazole (PRILOSEC) 20 MG capsule Take 1 po BID x 2 weeks then once a day 60 capsule 0   No current facility-administered medications for this visit.    Allergies as of 12/24/2014  . (No Known Allergies)    Family History  Problem Relation Age of Onset  . Colon cancer Neg Hx     Social  History   Social History  . Marital Status: Single    Spouse Name: N/A  . Number of Children: N/A  . Years of Education: N/A   Occupational History  . Not on file.   Social History Main Topics  . Smoking status: Current Every Day Smoker -- 1.00 packs/day    Types: Cigarettes  . Smokeless tobacco: Not on file  . Alcohol Use: No  . Drug Use: No     Comment: no marijuana in about a year   . Sexual Activity: Not on file   Other Topics Concern  . Not on file   Social History Narrative    Review of Systems: Gen: Denies any fever, chills, fatigue, weight loss, lack of appetite.  CV: Denies chest pain, heart palpitations, peripheral edema, syncope.  Resp: Denies shortness of breath at rest or with exertion. Denies wheezing or cough.  GI: see HPI  GU : Denies urinary burning, urinary frequency, urinary hesitancy MS: Denies joint pain, muscle weakness, cramps, or limitation of movement.  Derm: Denies rash, itching, dry skin Psych: Denies depression, anxiety, memory loss, and confusion Heme: Denies bruising, bleeding, and enlarged lymph nodes.  Physical Exam: BP 134/80 mmHg  Pulse 96  Temp(Src) 97 F (36.1 C)  Ht 6\' 2"  (1.88 m)  Wt 201 lb 6.4 oz (91.354 kg)  BMI 25.85 kg/m2 General:   Alert and oriented. Pleasant and cooperative. Well-nourished and well-developed.  Head:  Normocephalic and atraumatic. Eyes:  Without icterus, sclera clear and conjunctiva pink.  Ears:  Normal auditory acuity. Nose:  No deformity, discharge,  or lesions. Mouth:  No deformity or lesions, oral mucosa pink.  Lungs:  Clear to auscultation bilaterally. No wheezes, rales, or rhonchi. No distress.  Heart:  S1, S2 present without murmurs appreciated.  Abdomen:  +BS, soft, mild TTP periumbilically and non-distended. No HSM noted. No guarding or rebound. No masses appreciated.  Rectal:  Deferred  Msk:  Symmetrical without gross deformities. Normal posture. Extremities:  Without edema. Neurologic:   Alert and  oriented x4;  grossly normal neurologically. Psych:  Alert and cooperative. Normal mood and affect.  Lab Results  Component Value Date   WBC 9.1 12/03/2014   HGB 14.7 12/03/2014   HCT 41.9 12/03/2014   MCV 90.1 12/03/2014   PLT 276 12/03/2014   Lab Results  Component Value Date   ALT 15* 12/03/2014   AST 16 12/03/2014   ALKPHOS 62 12/03/2014   BILITOT 0.7 12/03/2014   Lab Results  Component Value Date   CREATININE 0.88 12/03/2014   BUN 12 12/03/2014   NA 140 12/03/2014   K 3.4* 12/03/2014   CL 103 12/03/2014   CO2 27 12/03/2014

## 2014-12-24 NOTE — Assessment & Plan Note (Signed)
24 year old male with periumbilical pain for approximately a month, associated low-volume hematemesis, exacerbated by certain foods as noted in HPI. Endorses Ibuprofen/Aleve intermittently. No improvement with PPI currently. Query esophagitis, gastritis, possible component of MW tear as source for hematochezia. Hgb has remained stable. Doubt biliary component to abdominal pain.   Stop Prilosec. Start Protonix. Stop Bentyl. Avoid all NSAIDs. Add Carafate for short-term. Proceed with upper endoscopy in the near future with Dr. Jena Gaussourk. The risks, benefits, and alternatives have been discussed in detail with patient. They have stated understanding and desire to proceed.  PROPOFOL due to history of bipolar disorder and previously on medications but NOT currently on medications. History of marijuana use as well but none in approximately a year.

## 2014-12-24 NOTE — Patient Instructions (Signed)
[facility] [place]. Stop Prilosec. Take Protonix once each morning, 30 minutes before breakfast. Take carafate four times a day.   Stop taking Bentyl. Avoid Ibuprofen, Advil, Aleve, Motrin.   We have scheduled you for an upper endoscopy with Dr. Jena Gaussourk in the near future.  Have a [place] Cabal[place]erry Christmas!

## 2015-01-11 NOTE — Patient Instructions (Signed)
John Saunders  01/11/2015     @PREFPERIOPPHARMACY @   Your procedure is scheduled on  01/18/2015  [facility] to [facility] at  645  A.M.  Call this number if you have problems the morning of surgery:  [phone]   Remember:  Do not eat food or drink liquids after midnight.  Take these medicines the morning of surgery with A SIP OF WATER  prilosec   Do not wear jewelry, make-up or nail polish.  Do not wear lotions, powders, or perfumes.  You may wear deodorant.  Do not shave 48 hours prior to surgery.  Men may shave face and neck.  Do not bring valuables to the hospital.  [facility] not [facility] any belongings or valuables.  Contacts, dentures or bridgework may not be worn into surgery.  Leave your suitcase in the car.  After surgery it may be brought to your room.  For patients admitted to the hospital, discharge time will be determined by your treatment team.  Patients discharged the day of surgery will not be allowed to drive home.   Name and phone number of your driver:   family Special instructions:  Follow the diet instructions given to you by Dr Luvenia Starchourk's office.  Please read over the following fact sheets that you were given. Pain Booklet, Coughing and Deep Breathing, Surgical Site Infection Prevention, Anesthesia Post-op Instructions and Care and Recovery After Surgery      Esophagogastroduodenoscopy Esophagogastroduodenoscopy (EGD) is a procedure that is used to examine the lining of the esophagus, stomach, and first part of the small intestine (duodenum). A long, flexible, lighted tube with a camera attached (endoscope) is inserted down the throat to view these organs. This procedure is done to detect problems or abnormalities, such as inflammation, bleeding, ulcers, or growths, in order to treat them. The procedure lasts 5-20 minutes. It is usually an outpatient procedure, but it may need to be performed in a hospital in emergency cases. LET [facility] KNOW ABOUT:  Any allergies you have.  All medicines you are taking, including vitamins, herbs, eye drops, creams, and over-the-counter medicines.  Previous problems you or members of your family have had with the use of anesthetics.  Any blood disorders you have.  Previous surgeries you have had.  Medical conditions you have. RISKS AND COMPLICATIONS Generally, this is a safe procedure. However, problems can occur and include:  Infection.  Bleeding.  Tearing (perforation) of the esophagus, stomach, or duodenum.  Difficulty breathing or not being able to breathe.  Excessive sweating.  Spasms of the larynx.  Slowed heartbeat.  Low blood pressure. BEFORE THE PROCEDURE  Do not eat or drink anything after midnight on the night before the procedure or as directed by your health care provider.  Do not take your regular medicines before the procedure if your health care provider asks you not to. Ask your health care provider about changing or stopping those medicines.  If you wear dentures, be prepared to remove them before the procedure.  Arrange for someone to drive you home after the procedure. PROCEDURE  A numbing medicine (local anesthetic) may be sprayed in your throat for comfort and to stop you from gagging or coughing.  You will have an IV tube inserted in a vein in your hand or arm. You will receive medicines and fluids through this tube.  You will be given a medicine to relax you (sedative).  A pain reliever will be given through the IV tube.  A mouth guard may be placed in your mouth to protect your teeth and to keep you from biting on the endoscope.  You will be asked to lie on your left side.  The endoscope will be inserted down your throat and into your esophagus, stomach, and duodenum.  Air will be put through the endoscope to allow your health care provider to clearly view the lining of your esophagus.  The lining of your  esophagus, stomach, and duodenum will be examined. During the exam, your health care provider may:  Remove tissue to be examined under a microscope (biopsy) for inflammation, infection, or other medical problems.  Remove growths.  Remove objects (foreign bodies) that are stuck.  Treat any bleeding with medicines or other devices that stop tissues from bleeding (hot cautery, clipping devices).  Widen (dilate) or stretch narrowed areas of your esophagus and stomach.  The endoscope will be withdrawn. AFTER THE PROCEDURE  You will be taken to a recovery area for observation. Your blood pressure, heart rate, breathing rate, and blood oxygen level will be monitored often until the medicines you were given have worn off.  Do not eat or drink anything until the numbing medicine has worn off and your gag reflex has returned. You may choke.  Your health care provider should be able to discuss his or her findings with you. It will take longer to discuss the test results if any biopsies were taken.   This information is not intended to replace advice given to you by your health care provider. Make sure you discuss any questions you have with your health care provider.   Document Released: 04/21/2004 Document Revised: 01/09/2014 Document Reviewed: 11/22/2011 Elsevier Interactive Patient Education 2016 [place]. Esophagogastroduodenoscopy, Care After Refer to this sheet in the next few weeks. These instructions provide you with information about caring for yourself after your procedure. Your health care provider may also give you more specific instructions. Your treatment has been planned according to current medical practices, but problems sometimes occur. Call your health care provider if you have any problems or questions after your procedure. WHAT TO EXPECT AFTER THE PROCEDURE After your procedure, it is typical to feel:  Soreness in your throat.  Pain with swallowing.  Sick to your  stomach (nauseous).  Bloated.  Dizzy.  Fatigued. HOME CARE INSTRUCTIONS  Do not eat or drink anything until the numbing medicine (local anesthetic) has worn off and your gag reflex has returned. You will know that the local anesthetic has worn off when you can swallow comfortably.  Do not drive or operate machinery until directed by your health care provider.  Take medicines only as directed by your health care provider. SEEK MEDICAL CARE IF:   You cannot stop coughing.  You are not urinating at all or less than usual. SEEK IMMEDIATE MEDICAL CARE IF:  You have difficulty swallowing.  You cannot eat or drink.  You have worsening throat or chest pain.  You have dizziness or lightheadedness or you faint.  You have nausea or vomiting.  You have chills.  You have a fever.  You have severe abdominal pain.  You have black, tarry, or bloody stools.   This information is not intended to replace advice given to you by your health care provider. Make sure you discuss any questions you have with your health care provider.   Document Released: 12/06/2011 Document Revised: 01/09/2014 Document Reviewed: 12/06/2011 Elsevier Interactive  Patient Education 2016 [employer] PATIENT INSTRUCTIONS POST-ANESTHESIA  IMMEDIATELY FOLLOWING SURGERY:  Do not drive or operate machinery for the first twenty four hours after surgery.  Do not make any important decisions for twenty four hours after surgery or while taking narcotic pain medications or sedatives.  If you develop intractable nausea and vomiting or a severe headache please notify your doctor immediately.  FOLLOW-UP:  Please make an appointment with your surgeon as instructed. You do not need to follow up with anesthesia unless specifically instructed to do so.  WOUND CARE INSTRUCTIONS (if applicable):  Keep a dry clean dressing on the anesthesia/puncture wound site if there is drainage.  Once the wound has quit draining you may leave  it open to air.  Generally you should leave the bandage intact for twenty four hours unless there is drainage.  If the epidural site drains for more than 36-48 hours please call the anesthesia department.  QUESTIONS?:  Please feel free to call your physician or the hospital operator if you have any questions, and they will be happy to assist you.

## 2015-01-13 ENCOUNTER — Encounter (HOSPITAL_COMMUNITY)
Admission: RE | Admit: 2015-01-13 | Discharge: 2015-01-13 | Disposition: A | Discharge status: Home / Self Care | Payer: Medicaid Other | Source: Ambulatory Visit | Attending: Internal Medicine | Admitting: Internal Medicine

## 2015-01-14 ENCOUNTER — Encounter (HOSPITAL_COMMUNITY): Admission: RE | Admit: 2015-01-14 | Payer: Medicaid Other | Source: Ambulatory Visit

## 2015-01-14 ENCOUNTER — Encounter (HOSPITAL_COMMUNITY): Payer: Self-pay | Admitting: Anesthesiology

## 2015-01-14 ENCOUNTER — Telehealth: Payer: Self-pay

## 2015-01-14 NOTE — Telephone Encounter (Signed)
Pt called and states he had 7 teeth pulled and will have to cancel his EGD for 01/18/2015.  Pt will call back and reshedule at a later date once everything is healed.

## 2015-01-18 ENCOUNTER — Ambulatory Visit (HOSPITAL_COMMUNITY): Admission: RE | Admit: 2015-01-18 | Payer: Medicaid Other | Source: Ambulatory Visit | Admitting: Internal Medicine

## 2015-01-18 ENCOUNTER — Encounter (HOSPITAL_COMMUNITY): Admission: RE | Payer: Self-pay | Source: Ambulatory Visit

## 2015-01-18 SURGERY — ESOPHAGOGASTRODUODENOSCOPY (EGD) WITH PROPOFOL
Anesthesia: Monitor Anesthesia Care

## 2015-02-01 NOTE — Telephone Encounter (Signed)
Called both numbers and can't reach pt by phone.  Will try again

## 2015-02-01 NOTE — Telephone Encounter (Signed)
Patient called back and would like to reschedule his EGD

## 2015-02-01 NOTE — Telephone Encounter (Signed)
After review of patients chart he will need OV before rescheduling

## 2015-02-02 ENCOUNTER — Emergency Department (HOSPITAL_COMMUNITY): Payer: Medicaid Other

## 2015-02-02 ENCOUNTER — Encounter (HOSPITAL_COMMUNITY): Payer: Self-pay | Admitting: Emergency Medicine

## 2015-02-02 ENCOUNTER — Emergency Department (HOSPITAL_COMMUNITY)
Admission: EM | Admit: 2015-02-02 | Discharge: 2015-02-02 | Disposition: A | Discharge status: Home / Self Care | Payer: Medicaid Other | Attending: Emergency Medicine | Admitting: Emergency Medicine

## 2015-02-02 DIAGNOSIS — Y998 Other external cause status: Secondary | ICD-10-CM | POA: Insufficient documentation

## 2015-02-02 DIAGNOSIS — Q860 Fetal alcohol syndrome (dysmorphic): Secondary | ICD-10-CM | POA: Diagnosis not present

## 2015-02-02 DIAGNOSIS — M79641 Pain in right hand: Secondary | ICD-10-CM

## 2015-02-02 DIAGNOSIS — S60511A Abrasion of right hand, initial encounter: Secondary | ICD-10-CM | POA: Diagnosis not present

## 2015-02-02 DIAGNOSIS — Y9389 Activity, other specified: Secondary | ICD-10-CM | POA: Insufficient documentation

## 2015-02-02 DIAGNOSIS — Y9289 Other specified places as the place of occurrence of the external cause: Secondary | ICD-10-CM | POA: Insufficient documentation

## 2015-02-02 DIAGNOSIS — Z8659 Personal history of other mental and behavioral disorders: Secondary | ICD-10-CM | POA: Insufficient documentation

## 2015-02-02 DIAGNOSIS — S6991XA Unspecified injury of right wrist, hand and finger(s), initial encounter: Secondary | ICD-10-CM | POA: Diagnosis present

## 2015-02-02 DIAGNOSIS — W2209XA Striking against other stationary object, initial encounter: Secondary | ICD-10-CM | POA: Diagnosis not present

## 2015-02-02 DIAGNOSIS — F17210 Nicotine dependence, cigarettes, uncomplicated: Secondary | ICD-10-CM | POA: Insufficient documentation

## 2015-02-02 NOTE — Discharge Instructions (Signed)
At home may wish to ice/elevate hand to help with pain/swelling. May also take motrin/tylenol for pain. Follow-up with your primary care physician. Return here for new concerns.

## 2015-02-02 NOTE — Telephone Encounter (Signed)
Pt has office appointment on 02/10/15 @ 11:00

## 2015-02-02 NOTE — ED Notes (Signed)
Patient with no complaints at this time. Respirations even and unlabored. Skin warm/dry. Discharge instructions reviewed with patient at this time. Patient given opportunity to voice concerns/ask questions. Patient discharged at this time and left Emergency Department with steady gait.   

## 2015-02-02 NOTE — ED Provider Notes (Signed)
CSN: [id number]     Arrival date & time 02/02/15  1006 History   First MD Initiated Contact with Patient 02/02/15 1032     Chief Complaint  Patient presents with  . Hand Injury     (Consider location/radiation/quality/duration/timing/severity/associated sxs/prior Treatment) Patient is a 25 y.o. male presenting with hand injury. The history is provided by the patient and medical records.  Hand Injury   25 year old male with history of ADHD, bipolar disorder, fetal alcohol syndrome, presenting to the ED for right hand pain after hitting a brick wall yesterday. He states he punched the wall because he was upset. He states throughout the night he has had worsening pain in his right hand. He denies any bleeding. Patient is right-hand dominant. No numbness or weakness of his hand or fingers.  Past Medical History  Diagnosis Date  . Back pain   . ADHD (attention deficit hyperactivity disorder)   . Bipolar 1 disorder (HCC)   . Separation anxiety   . Fetal alcohol syndrome    Past Surgical History  Procedure Laterality Date  . None    . Dental surgery     Family History  Problem Relation Age of Onset  . Colon cancer Neg Hx    Social History  Substance Use Topics  . Smoking status: Current Every Day Smoker -- 1.00 packs/day    Types: Cigarettes  . Smokeless tobacco: None  . Alcohol Use: No    Review of Systems  Musculoskeletal: Positive for joint swelling and arthralgias.  All other systems reviewed and are negative.     Allergies  Nsaids  Home Medications   Prior to Admission medications   Not on File   BP 140/72 mmHg  Pulse 81  Temp(Src) 98.3 F (36.8 C) (Oral)  Resp 18  Ht  (1.88 m)  Wt 88.451 kg  BMI 25.03 kg/m2  SpO2 99%   Physical Exam  Constitutional: He is oriented to person, place, and time. He appears well-developed and well-nourished. No distress.  HENT:  Head: Normocephalic and atraumatic.  Mouth/Throat: Oropharynx is clear and moist.    Eyes: Conjunctivae and EOM are normal. Pupils are equal, round, and reactive to light.  Neck: Normal range of motion.  Cardiovascular: Normal rate, regular rhythm and normal heart sounds.   Pulmonary/Chest: Effort normal and breath sounds normal.  Abdominal: Soft. Bowel sounds are normal.  Musculoskeletal:       Right hand: He exhibits decreased range of motion, tenderness, bony tenderness and swelling. He exhibits normal two-point discrimination, normal capillary refill, no deformity and no laceration. Normal sensation noted. Normal strength noted.       Hands: Right hand with multiple abrasions noted; he has tenderness and swelling noted to dorsal hand along 5th metacarpal; limited flexion of 5th digit, normal ROM of all other fingers; strong radial pulse and cap refill; normal sensation to all fingers  Neurological: He is alert and oriented to person, place, and time.  Skin: Skin is warm and dry. He is not diaphoretic.  Psychiatric: He has a normal mood and affect.  Nursing note and vitals reviewed.   ED Course  Procedures (including critical care time) Labs Review Labs Reviewed - No data to display  Imaging Review Dg Hand Complete Right  02/02/2015  CLINICAL DATA:  Punched a wall yesterday, pain at fifth metacarpal with swelling EXAM: RIGHT HAND - COMPLETE 3+ VIEW COMPARISON:  07/19/2011 FINDINGS: Osseous mineralization normal. Joint spaces preserved. Soft tissue swelling overlies the dorsum of the  hand at the level of the distal metacarpals and MCP joints. No definite acute fracture, dislocation, or bone destruction. IMPRESSION: No acute osseous abnormalities. Electronically Signed   By: Ulyses Southward M.D.   On: 02/02/2015 11:05   I have personally reviewed and evaluated these images and lab results as part of my medical decision-making.   EKG Interpretation None      MDM   Final diagnoses:  Right hand pain   25 year old male here with right hand pain after punching a brick  wall last night. His exam findings are concerning for potential boxer's fracture. Hand remains neurovascularly intact. X-ray pending.  X-ray negative for acute findings.  Patient d/c home with supportive care.  He was offered an ace wrap for comfort which he declined.  Patient has allergy to NSAIDs, may take tylenol.  Discussed plan with patient, he/she acknowledged understanding and agreed with plan of care.  Return precautions given for new or worsening symptoms.   Garlon Hatchet, PA-C 02/02/15 1119  Zadie Rhine, MD 02/02/15 1210

## 2015-02-02 NOTE — ED Notes (Signed)
PT c/o right hand pain and swelling after hitting a brick wall with his right hand yesterday evening.

## 2015-02-10 ENCOUNTER — Ambulatory Visit (INDEPENDENT_AMBULATORY_CARE_PROVIDER_SITE_OTHER): Payer: Medicaid Other | Admitting: Nurse Practitioner

## 2015-02-10 ENCOUNTER — Other Ambulatory Visit: Payer: Self-pay

## 2015-02-10 ENCOUNTER — Encounter: Payer: Self-pay | Admitting: Nurse Practitioner

## 2015-02-10 VITALS — BP 108/66 | HR 87 | Temp 97.6°F | Ht 74.0 in | Wt 196.6 lb

## 2015-02-10 DIAGNOSIS — K219 Gastro-esophageal reflux disease without esophagitis: Secondary | ICD-10-CM | POA: Diagnosis not present

## 2015-02-10 DIAGNOSIS — K920 Hematemesis: Secondary | ICD-10-CM | POA: Diagnosis not present

## 2015-02-10 DIAGNOSIS — R110 Nausea: Secondary | ICD-10-CM | POA: Diagnosis not present

## 2015-02-10 NOTE — Assessment & Plan Note (Signed)
Patient with GERD-like symptoms. Was started on Protonix at last office visit and symptoms improved however had not resolved. Still has breakthrough symptoms, typically in the evenings. Does not eat within 4 hours of going to bed. Is trying to follow a diet to avoid trigger foods. No further NSAID since instructed not to. Recommend continue taking Protonix. We will proceed with an endoscopy as previously recommended due to continued symptoms and breakthrough symptoms despite PPI use. Return for follow-up in 2 months.  Proceed with EGD in the OR with propofol/MAC with Dr. Jena Gauss in near future: the risks, benefits, and alternatives have been discussed with the patient in detail. The patient states understanding and desires to proceed.  The patient is not currently taking any medications other than Protonix. He does have a history of bipolar and ADHD and was previously on medications for this. Also, marijuana use within the past year. Given this and his young age we'll plan for the procedure and the OR on propofol/MAC to promote adequate sedation.

## 2015-02-10 NOTE — Progress Notes (Signed)
NO PCP PER PATIENT °

## 2015-02-10 NOTE — Assessment & Plan Note (Signed)
Symptom has resolved, no further vomiting since last office visit. Return for follow-up in 2 months.

## 2015-02-10 NOTE — Progress Notes (Signed)
Referring Provider: No ref. provider found Primary Care Physician:  No PCP Per Patient Primary GI:  Dr. Jena Gauss  Chief Complaint  Patient presents with  . Colonoscopy    HPI:   25 year old male presents for follow-up on nausea and hematemesis. Last seen in our office on 12/24/2014 following emergency department visit for abdominal pain. CT on file in December 2016 with no acute abnormalities, labs unremarkable. At the time of his office visit he described sharp, intermittent periumbilical pain with associated low-volume hematemesis. A few food triggers were identified including hot dogs, tacos, hamburgers. Intermittent NSAID use, PPI without improvement. Differentials at that time included esophagitis, gastritis and possible Mallory-Weiss tear for etiology of hematemesis. Hemoglobin stable. At that time he was started on Protonix as a replacement of Prilosec, avoid all NSAIDs, add Carafate, stop Bentyl. He was set up for an upper endoscopy for further evaluation. This had to be canceled due to tooth extraction.  Today he states some improvement with Protonix. Stopped taking them about 2-3 weeks ago, wasn't aware he had any refills. Still has breakthrough periumbilical pain which resolves somewhat with milk and/or water. Will recur later one. Has esophageal burning, denies bitter taste. Eats dinner by 7pm, typically goes to sleep 11 pm or later. No further N/V. Will have "bubbly" feeling in his stomach. Denies hematochezia, melena, unintentional weight loss, fever, chills. Is having some night sweats. Denies chest pain, dyspnea, dizziness, lightheadedness, syncope, near syncope. Denies any other upper or lower GI symptoms.  Past Medical History  Diagnosis Date  . Back pain   . ADHD (attention deficit hyperactivity disorder)   . Bipolar 1 disorder (HCC)   . Separation anxiety   . Fetal alcohol syndrome     Past Surgical History  Procedure Laterality Date  . None    . Dental surgery       No current outpatient prescriptions on file.   No current facility-administered medications for this visit.    Allergies as of 02/10/2015 - Review Complete 02/10/2015  Allergen Reaction Noted  . Nsaids Other (See Comments) 02/02/2015    Family History  Problem Relation Age of Onset  . Colon cancer Neg Hx     Social History   Social History  . Marital Status: Single    Spouse Name: N/A  . Number of Children: N/A  . Years of Education: N/A   Social History Main Topics  . Smoking status: Current Every Day Smoker -- 1.00 packs/day    Types: Cigarettes  . Smokeless tobacco: None  . Alcohol Use: No  . Drug Use: No     Comment: no marijuana in about a year   . Sexual Activity: Not Asked   Other Topics Concern  . None   Social History Narrative    Review of Systems: General: Negative for anorexia, weight loss, fever, chills, fatigue, weakness. ENT: Negative for hoarseness, difficulty swallowing. CV: Negative for chest pain, angina, palpitations, peripheral edema.  Respiratory: Negative for dyspnea at rest, cough, sputum, wheezing.  GI: See history of present illness. Endo: Negative for unusual weight change.    Physical Exam: BP 108/66 mmHg  Pulse 87  Temp(Src) 97.6 F (36.4 C) (Oral)  Ht  (1.88 m)  Wt 196 lb 9.6 oz (89.177 kg)  BMI 25.23 kg/m2 General:   Alert and oriented. Pleasant and cooperative. Well-nourished and well-developed.  Head:  Normocephalic and atraumatic. Eyes:  Without icterus, sclera clear and conjunctiva pink.  Ears:  Normal auditory acuity. Cardiovascular:  S1, S2 present without murmurs appreciated. Extremities without clubbing or edema. Respiratory:  Clear to auscultation bilaterally. No wheezes, rales, or rhonchi. No distress.  Gastrointestinal:  +BS, soft, and non-distended. Epigastric TTP noted. No HSM noted. No guarding or rebound. No masses appreciated.  Rectal:  Deferred  Musculoskalatal:  Symmetrical without gross  deformities. Normal posture. Skin:  Intact without significant lesions or rashes. Neurologic:  Alert and oriented x4;  grossly normal neurologically. Psych:  Alert and cooperative. Normal mood and affect.    02/10/2015 11:24 AM

## 2015-02-10 NOTE — Patient Instructions (Signed)
1. Keep taking Protonix. Call your pharmacy and asked him to fill a refill of your medication.  2. We will schedule your procedure for you. 3. Further recommendations to be based on the results of your procedure. 4. Return for follow-up in 2 months.

## 2015-02-26 NOTE — Patient Instructions (Signed)
BRODRIC SCHAUER  02/26/2015     @   Your procedure is scheduled on  03/04/2015   Report to [facility] at  1100  A.M.  Call this number if you have problems the morning of surgery:  [phone]   Remember:  Do not eat food or drink liquids after midnight.  Take these medicines the morning of surgery with A SIP OF WATER  none   Do not wear jewelry, make-up or nail polish.  Do not wear lotions, powders, or perfumes.  You may wear deodorant.  Do not shave 48 hours prior to surgery.  Men may shave face and neck.  Do not bring valuables to the hospital.  [facility] is not responsible for any belongings or valuables.  Contacts, dentures or bridgework may not be worn into surgery.  Leave your suitcase in the car.  After surgery it may be brought to your room.  For patients admitted to the hospital, discharge time will be determined by your treatment team.  Patients discharged the day of surgery will not be allowed to drive home.   Name and phone number of your driver:   family Special instructions:  Follow the diet instructions given to you by Dr Luvenia Starch office.  Please read over the following fact sheets that you were given. Coughing and Deep Breathing, Surgical Site Infection Prevention, Anesthesia Post-op Instructions and Care and Recovery After Surgery      Esophagogastroduodenoscopy Esophagogastroduodenoscopy (EGD) is a procedure that is used to examine the lining of the esophagus, stomach, and first part of the small intestine (duodenum). A long, flexible, lighted tube with a camera attached (endoscope) is inserted down the throat to view these organs. This procedure is done to detect problems or abnormalities, such as inflammation, bleeding, ulcers, or growths, in order to treat them. The procedure lasts 5-20 minutes. It is usually an outpatient procedure, but it may need to be performed in a hospital in emergency cases. LET [facility] CARE PROVIDER  KNOW ABOUT:  Any allergies you have.  All medicines you are taking, including vitamins, herbs, eye drops, creams, and over-the-counter medicines.  Previous problems you or members of your family have had with the use of anesthetics.  Any blood disorders you have.  Previous surgeries you have had.  Medical conditions you have. RISKS AND COMPLICATIONS Generally, this is a safe procedure. However, problems can occur and include:  Infection.  Bleeding.  Tearing (perforation) of the esophagus, stomach, or duodenum.  Difficulty breathing or not being able to breathe.  Excessive sweating.  Spasms of the larynx.  Slowed heartbeat.  Low blood pressure. BEFORE THE PROCEDURE  Do not eat or drink anything after midnight on the night before the procedure or as directed by your health care provider.  Do not take your regular medicines before the procedure if your health care provider asks you not to. Ask your health care provider about changing or stopping those medicines.  If you wear dentures, be prepared to remove them before the procedure.  Arrange for someone to drive you home after the procedure. PROCEDURE  A numbing medicine (local anesthetic) may be sprayed in your throat for comfort and to stop you from gagging or coughing.  You will have an IV tube inserted in a vein in your hand or arm. You will receive medicines and fluids through this tube.  You will be given a medicine to relax you (sedative).  A pain  reliever will be given through the IV tube.  A mouth guard may be placed in your mouth to protect your teeth and to keep you from biting on the endoscope.  You will be asked to lie on your left side.  The endoscope will be inserted down your throat and into your esophagus, stomach, and duodenum.  Air will be put through the endoscope to allow your health care provider to clearly view the lining of your esophagus.  The lining of your esophagus, stomach, and  duodenum will be examined. During the exam, your health care provider may:  Remove tissue to be examined under a microscope (biopsy) for inflammation, infection, or other medical problems.  Remove growths.  Remove objects (foreign bodies) that are stuck.  Treat any bleeding with medicines or other devices that stop tissues from bleeding (hot cautery, clipping devices).  Widen (dilate) or stretch narrowed areas of your esophagus and stomach.  The endoscope will be withdrawn. AFTER THE PROCEDURE  You will be taken to a recovery area for observation. Your blood pressure, heart rate, breathing rate, and blood oxygen level will be monitored often until the medicines you were given have worn off.  Do not eat or drink anything until the numbing medicine has worn off and your gag reflex has returned. You may choke.  Your health care provider should be able to discuss his or her findings with you. It will take longer to discuss the test results if any biopsies were taken.   This information is not intended to replace advice given to you by your health care provider. Make sure you discuss any questions you have with your health care provider.   Document Released: 04/21/2004 Document Revised: 01/09/2014 Document Reviewed: 11/22/2011 Elsevier Interactive Patient Education 2016 [employer]. Esophagogastroduodenoscopy, Care After Refer to this sheet in the next few weeks. These instructions provide you with information about caring for yourself after your procedure. Your health care provider may also give you more specific instructions. Your treatment has been planned according to current medical practices, but problems sometimes occur. Call your health care provider if you have any problems or questions after your procedure. WHAT TO EXPECT AFTER THE PROCEDURE After your procedure, it is typical to feel:  Soreness in your throat.  Pain with swallowing.  Sick to your stomach  (nauseous).  Bloated.  Dizzy.  Fatigued. HOME CARE INSTRUCTIONS  Do not eat or drink anything until the numbing medicine (local anesthetic) has worn off and your gag reflex has returned. You will know that the local anesthetic has worn off when you can swallow comfortably.  Do not drive or operate machinery until directed by your health care provider.  Take medicines only as directed by your health care provider. SEEK MEDICAL CARE IF:   You cannot stop coughing.  You are not urinating at all or less than usual. SEEK IMMEDIATE MEDICAL CARE IF:  You have difficulty swallowing.  You cannot eat or drink.  You have worsening throat or chest pain.  You have dizziness or lightheadedness or you faint.  You have nausea or vomiting.  You have chills.  You have a fever.  You have severe abdominal pain.  You have black, tarry, or bloody stools.   This information is not intended to replace advice given to you by your health care provider. Make sure you discuss any questions you have with your health care provider.   Document Released: 12/06/2011 Document Revised: 01/09/2014 Document Reviewed: 12/06/2011 Elsevier Interactive Patient Education  2016 Elsevier Inc. PATIENT INSTRUCTIONS POST-ANESTHESIA  IMMEDIATELY FOLLOWING SURGERY:  Do not drive or operate machinery for the first twenty four hours after surgery.  Do not make any important decisions for twenty four hours after surgery or while taking narcotic pain medications or sedatives.  If you develop intractable nausea and vomiting or a severe headache please notify your doctor immediately.  FOLLOW-UP:  Please make an appointment with your surgeon as instructed. You do not need to follow up with anesthesia unless specifically instructed to do so.  WOUND CARE INSTRUCTIONS (if applicable):  Keep a dry clean dressing on the anesthesia/puncture wound site if there is drainage.  Once the wound has quit draining you may leave it open  to air.  Generally you should leave the bandage intact for twenty four hours unless there is drainage.  If the epidural site drains for more than 36-48 hours please call the anesthesia department.  QUESTIONS?:  Please feel free to call your physician or the hospital operator if you have any questions, and they will be happy to assist you.

## 2015-03-01 ENCOUNTER — Telehealth: Payer: Self-pay

## 2015-03-01 ENCOUNTER — Encounter (HOSPITAL_COMMUNITY)
Admission: RE | Admit: 2015-03-01 | Discharge: 2015-03-01 | Disposition: A | Discharge status: Home / Self Care | Payer: Medicaid Other | Source: Ambulatory Visit | Attending: Internal Medicine | Admitting: Internal Medicine

## 2015-03-01 NOTE — Telephone Encounter (Signed)
Endo staff called and informed John Saunders that pt did not show for his pre-op appointment.  Called pt and LMOM to call short stay to get this scheduled so he could have procedure on Thursday 03/02

## 2015-03-01 NOTE — Pre-Procedure Instructions (Signed)
Patient did not show for PAT. Left message for him to call back and reschedule PAT.

## 2015-03-02 ENCOUNTER — Encounter (HOSPITAL_COMMUNITY): Payer: Self-pay | Admitting: Anesthesiology

## 2015-03-04 ENCOUNTER — Encounter (HOSPITAL_COMMUNITY): Admission: RE | Payer: Self-pay | Source: Ambulatory Visit

## 2015-03-04 ENCOUNTER — Ambulatory Visit (HOSPITAL_COMMUNITY): Admission: RE | Admit: 2015-03-04 | Payer: Medicaid Other | Source: Ambulatory Visit | Admitting: Internal Medicine

## 2015-03-04 SURGERY — ESOPHAGOGASTRODUODENOSCOPY (EGD) WITH PROPOFOL
Anesthesia: Monitor Anesthesia Care

## 2015-04-12 ENCOUNTER — Ambulatory Visit: Payer: Medicaid Other | Admitting: Nurse Practitioner

## 2015-04-12 ENCOUNTER — Telehealth: Payer: Self-pay | Admitting: Nurse Practitioner

## 2015-04-12 ENCOUNTER — Encounter: Payer: Self-pay | Admitting: Nurse Practitioner

## 2015-04-12 NOTE — Telephone Encounter (Signed)
Noted  

## 2015-04-12 NOTE — Telephone Encounter (Signed)
PATIENT WAS A NO SHOW AND LETTER SENT  °
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
## 2016-07-15 ENCOUNTER — Encounter (HOSPITAL_COMMUNITY): Payer: Self-pay | Admitting: Emergency Medicine

## 2016-07-15 ENCOUNTER — Emergency Department (HOSPITAL_COMMUNITY)
Admission: EM | Admit: 2016-07-15 | Discharge: 2016-07-15 | Disposition: A | Discharge status: Home / Self Care | Payer: Medicaid Other | Attending: Emergency Medicine | Admitting: Emergency Medicine

## 2016-07-15 DIAGNOSIS — R6884 Jaw pain: Secondary | ICD-10-CM | POA: Insufficient documentation

## 2016-07-15 DIAGNOSIS — F17210 Nicotine dependence, cigarettes, uncomplicated: Secondary | ICD-10-CM | POA: Insufficient documentation

## 2016-07-15 MED ORDER — TRAMADOL HCL 50 MG PO TABS
50.0000 mg | ORAL_TABLET | Freq: Once | ORAL | Status: AC
  Administered 2016-07-15: 50 mg via ORAL
  Filled 2016-07-15: qty 1
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
## 2016-07-15 NOTE — Discharge Instructions (Signed)
You can take Tylenol or Ibuprofen as directed for pain.  Apply ice to the affected area for pain relief.  Follow-up with the referred ENT doctor for further evaluation.  Return the emergency Department for any worsening pain, fever, difficulty swallowing, difficulty eating or any other worsening or concerning symptoms.

## 2016-07-15 NOTE — ED Notes (Signed)
Advised pt not to drive after receiving medication. Pt verbalized understanding.
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
## 2016-07-21 ENCOUNTER — Encounter (HOSPITAL_COMMUNITY): Payer: Self-pay | Admitting: Emergency Medicine

## 2016-07-21 ENCOUNTER — Emergency Department (HOSPITAL_COMMUNITY)
Admission: EM | Admit: 2016-07-21 | Discharge: 2016-07-21 | Disposition: A | Discharge status: Home Health | Payer: Medicaid Other | Attending: Emergency Medicine | Admitting: Emergency Medicine

## 2016-07-21 DIAGNOSIS — Y929 Unspecified place or not applicable: Secondary | ICD-10-CM | POA: Insufficient documentation

## 2016-07-21 DIAGNOSIS — F17210 Nicotine dependence, cigarettes, uncomplicated: Secondary | ICD-10-CM | POA: Insufficient documentation

## 2016-07-21 DIAGNOSIS — S61012A Laceration without foreign body of left thumb without damage to nail, initial encounter: Secondary | ICD-10-CM

## 2016-07-21 DIAGNOSIS — Y9389 Activity, other specified: Secondary | ICD-10-CM | POA: Insufficient documentation

## 2016-07-21 DIAGNOSIS — Y999 Unspecified external cause status: Secondary | ICD-10-CM | POA: Insufficient documentation

## 2016-07-21 DIAGNOSIS — W260XXA Contact with knife, initial encounter: Secondary | ICD-10-CM | POA: Insufficient documentation

## 2016-07-21 HISTORY — DX: Depression, unspecified: F32.A

## 2016-07-21 HISTORY — DX: Major depressive disorder, single episode, unspecified: F32.9

## 2016-07-21 MED ORDER — LIDOCAINE HCL (PF) 1 % IJ SOLN
5.0000 mL | Freq: Once | INTRAMUSCULAR | Status: DC
  Filled 2016-07-21: qty 5

## 2016-07-21 MED ORDER — POVIDONE-IODINE 10 % EX SOLN
CUTANEOUS | Status: AC
  Filled 2016-07-21: qty 15
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
## 2016-08-06 ENCOUNTER — Inpatient Hospital Stay: Admit: 2016-08-06 | Discharge: 2016-08-06

## 2016-08-06 DIAGNOSIS — R252 Cramp and spasm: Secondary | ICD-10-CM

## 2016-08-06 DIAGNOSIS — F17210 Nicotine dependence, cigarettes, uncomplicated: Secondary | ICD-10-CM

## 2016-08-06 DIAGNOSIS — S02609A Fracture of mandible, unspecified, initial encounter for closed fracture: Principal | ICD-10-CM

## 2016-08-06 DIAGNOSIS — L551 Sunburn of second degree: Principal | ICD-10-CM

## 2016-08-06 DIAGNOSIS — L559 Sunburn, unspecified: Secondary | ICD-10-CM

## 2016-08-06 MED ORDER — BOLUS IV FLUID JX
Freq: Once | INTRAVENOUS | Status: CP
Start: 2016-08-06 — End: ?

## 2016-08-06 MED ORDER — IBUPROFEN 600 MG PO TABS
600 mg | Freq: Four times a day (QID) | ORAL | 0 refills | Status: CP | PRN
Start: 2016-08-06 — End: ?

## 2016-08-06 MED ORDER — IBUPROFEN 400 MG PO TABS
Freq: Four times a day (QID) | ORAL | PRN
Start: 2016-08-06 — End: ?

## 2016-08-06 MED ORDER — MAGNESIUM OXIDE 400 (241.3 MG) MG PO TABS
400 mg | Freq: Once | ORAL | Status: CP
Start: 2016-08-06 — End: ?

## 2016-08-06 MED ORDER — TRIPLE ANTIBIOTIC 5-400-5000 EX OINT
Freq: Once | TOPICAL | Status: CP
Start: 2016-08-06 — End: ?

## 2016-08-06 MED ORDER — BACITRACIN-NEOMYCIN-POLYMYXIN 400-5-5000 EX OINT
0 refills | Status: CP
Start: 2016-08-06 — End: ?

## 2016-08-06 MED ORDER — LIDOCAINE-ALOE VERA 0.5 % EX GEL
0 refills | Status: CP
Start: 2016-08-06 — End: ?

## 2016-08-06 MED ORDER — MORPHINE SULFATE 2 MG/ML IV SOLN CUSTOM JX
2 mg | Freq: Once | INTRAVENOUS | Status: CP
Start: 2016-08-06 — End: ?
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
## 2016-08-06 NOTE — ED Notes
IV removed with catheter intact.  Pressure applied, Time of discharge: 1305  PM., Patient discharged to  Home.  Patient discharged  ambulatory. to exit with belongings in  Stable condition.  Patient escorted by  family., Written discharge instructions given to  patient.  Patient/recipient  verbalizes discharge instructions.
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
## 2016-08-06 NOTE — ED Provider Notes
History   No chief complaint on file.      HPI    Allergies not on file    Patient's Medications    No medications on file       No past medical history on file.    No past surgical history on file.    No family history on file.    Social History     Social History   ? Marital status: N/A     Spouse name: N/A   ? Number of children: N/A   ? Years of education: N/A     Social History Main Topics   ? Smoking status: Not on file   ? Smokeless tobacco: Not on file   ? Alcohol use Not on file   ? Drug use: Unknown   ? Sexual activity: Not on file     Other Topics Concern   ? Not on file     Social History Narrative   ? No narrative on file       Review of Systems    Physical Exam     ED Triage Vitals   BP    Pulse    Resp    Temp    Temp src    Height    Weight    SpO2    BMI (Calculated)              Physical Exam    Differential DDx: ***    Is this an Emergent Medical Condition? {SH ED EMERGENT MEDICAL CONDITION:1602057001}  409.901 FS  641.19 FS  627.732 (16) FS    ED Workup   Procedures    Labs:  - - No data to display      Imaging (Read by ED Provider):  {Imaging findings:1603150103}      EKG (Read by ED Provider):  {EKG findings:1603150102}        ED Course & Re-Evaluation          MDM   Decide to obtain history from someone other than the patient: {SH ED JX MDM - OBTAIN HISTORY:28378}    Decide to obtain previous medical records: {SH ED JX MDM - PREVIOUS MED REC - NO YES:28380}    Clinical Lab Test(s): {SH ED JX MDM ORDERED AND REVIEWED:28124}    Diagnostic Tests (Radiology, EKG): {SH ED JX MDM ORDERED AND REVIEWED:28124}    Independent Visualization (ED US, Wet Prep, Other): {SH ED JX MDM NO YES WILDCARD:26444}    Discussed patient with NON-ED Provider: {SH ED JX MDM - ANOTHER PROVIDER:28381}      ED Disposition   ED Disposition: No ED Disposition Set      ED Clinical Impression   ED Clinical Impression:   No Clinical Impression Set      ED Patient Status   Patient Status:   {SH ED JX PATIENT STATUS:1602057102}
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
## 2016-08-06 NOTE — ED Provider Notes
ED Workup   Procedures    Labs:  - - No data to display      Imaging (Read by ED Provider):  not applicable      EKG (Read by ED Provider):  not applicable        ED Course & Re-Evaluation    26 year old M otherwise healthy presents for sunburn x 3 days. Also reports associated cramping. Reports falling asleep in the sun for 12 hours without using sunscreen. On exam, patient looks uncomfortable. He has scattered areas of second degree burn across his anterior/posterior chest and his anterior BUEs. I estimate about 10% area of 2nd degree burn. Will check labs to assess for rhabdomyolsis and advise aloe usage.  Diona FantiLandon Lichtman, MD 11:22 AM 08/06/2016        MDM   Decide to obtain history from someone other than the patient: Yes - EMS    Decide to obtain previous medical records: No    Clinical Lab Test(s): Ordered and Reviewed    Diagnostic Tests (Radiology, EKG): N/A    Independent Visualization (ED US, Wet Prep, Other): No    Discussed patient with NON-ED Provider: None      ED Disposition   ED Disposition: No ED Disposition Set      ED Clinical Impression   ED Clinical Impression:   No Clinical Impression Set      ED Patient Status   Patient Status:   Good        ED Medical Evaluation Initiated   Medical Evaluation Initiated:  Yes, filed at 08/06/16 1107  by Diona FantiLichtman, Landon, MD

## 2016-08-06 NOTE — ED Provider Notes
ED Medical Evaluation Initiated   Medical Evaluation Initiated:  Yes, filed at 08/06/16 1107  by Diona FantiLichtman, Landon, MD

## 2016-08-06 NOTE — ED Provider Notes
Gastrointestinal: Negative for nausea, vomiting, abdominal pain and diarrhea.   Genitourinary: Negative for dysuria.   Musculoskeletal: Negative for back pain.   Skin: Positive for color change and pallor.   Neurological: Negative for headaches.   All other systems reviewed and are negative.      Physical Exam     ED Triage Vitals   BP    Pulse    Resp    Temp    Temp src    Height    Weight    SpO2    BMI (Calculated)              Physical Exam   Constitutional: He is oriented to person, place, and time. He appears well-developed and well-nourished. No distress.   Uncomfortable appearing M resting in bed in NAD.   HENT:   Head: Normocephalic and atraumatic.   Mouth/Throat: Oropharynx is clear and moist.   Eyes: Pupils are equal, round, and reactive to light. EOM are normal.   Neck: Normal range of motion. Neck supple.   Cardiovascular: Normal rate, regular rhythm, normal heart sounds and intact distal pulses.  Exam reveals no gallop and no friction rub.    No murmur heard.  Pulmonary/Chest: Effort normal and breath sounds normal. No respiratory distress. He has no wheezes. He has no rales.   Abdominal: Soft. Bowel sounds are normal. He exhibits no distension. There is no tenderness. There is no guarding.   Musculoskeletal: Normal range of motion.   Neurological: He is alert and oriented to person, place, and time.   Skin: Skin is warm and dry. He is not diaphoretic.        Diffuse first degree burns noted to anterior/posterior torso and to all extremities.  Scattered blistering suggestive of secondary burn noted to BUE, chest, and back.  No third degree burns noted.  Estimated second degree burn involvement is 10%.   Psychiatric: He has a normal mood and affect. Judgment normal.   Nursing note and vitals reviewed.      Differential DDx: Burn, rhabdomyolysis, dehydration, and others.    Is this an Emergent Medical Condition? Yes - Severe Pain/Acute Onset of Symptons  409.901 FS  641.19 FS  627.732 (16) FS

## 2016-08-06 NOTE — ED Provider Notes
History     Chief Complaint   Patient presents with   ? Sunburn       26 year old M otherwise healthy presents for sunburn x 3 days. Also reports associated cramping. Reports falling asleep in the sun for 12 hours without using sunscreen. He has been using moisturizing lotion which has not been helpful. He also reports that he has been sleeping in his car over the last 3 days as he is moving to [place].      The history is provided by the patient. No language interpreter was used.   Burn   Burn location:  Torso, shoulder/arm and leg  Shoulder/arm burn location:  L upper arm and R upper arm  Torso burn location:  L chest and R chest  Leg burn location:  L leg and R leg  Burn quality:  Intact blister, red and painful  Time since incident:  3 days  Progression:  Unchanged  Mechanism of burn:  Sun  Incident location:  Outside  Relieved by:  Nothing  Ineffective treatments: Lotions.  Associated symptoms: no cough, no difficulty swallowing, no eye pain, no nasal burns and no shortness of breath    Tetanus status:  Unknown      Allergies   Allergen Reactions   ? Acetaminophen Rash       Discharge Medication List as of 08/06/2016 12:38 PM      START taking these medications    Details   !! ibuprofen (ADVIL,MOTRIN) 600 MG PO Tablet Take 1 tablet by mouth every 6 hours as needed.Disp-30 tablet, R-0, Normal      Lidocaine-Aloe Vera (BURN RELIEF/LIDOCAINE/ALOE) 0.5 % EX Gel Apply to affected areas neededDisp-113 g, R-0, Normal      neomycin-bacitracin-polymyxin (NEOSPORIN) [id number] EX Ointment Apply topically 2 times dailyDisp-15 g, R-0, Normal       !! - Potential duplicate medications found. Please discuss with provider.      CONTINUE these medications which have NOT CHANGED    Details   !! ibuprofen (ADVIL,MOTRIN) 400 MG PO Tablet Take by mouth every 6 hours as needed for pain.Historical Med       !! - Potential duplicate medications found. Please discuss with provider.          Past Medical History:   Diagnosis Date

## 2016-08-07 NOTE — ED Attestation Note
Attestation   I discussed this patient with the resident/fellow.              Deirdre PippinsKumetz, Christopher, MD  08/07/16 [phone]

## 2017-10-17 IMAGING — DX DG HAND COMPLETE 3+V*R*
3 series · 3 of 3 positions shown · non-contrast
Comparison: 07/19/2011

CLINICAL DATA: Punched a wall yesterday, pain at fifth metacarpal
with swelling

EXAM:
RIGHT HAND - COMPLETE 3+ VIEW

[hand pa]
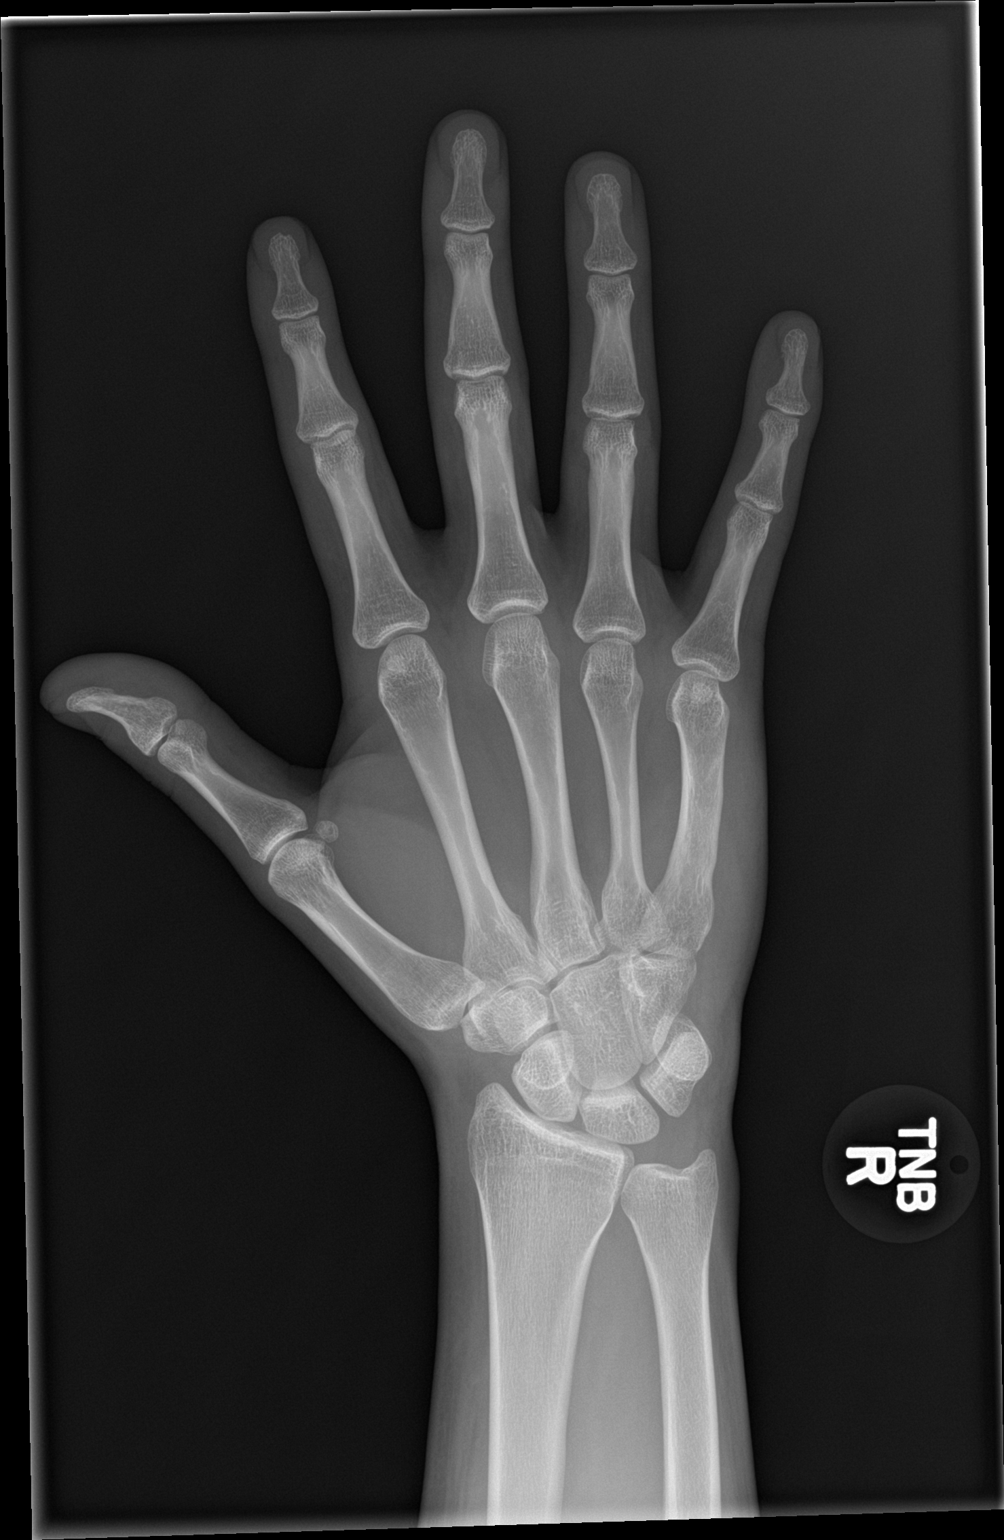

[hand obl]
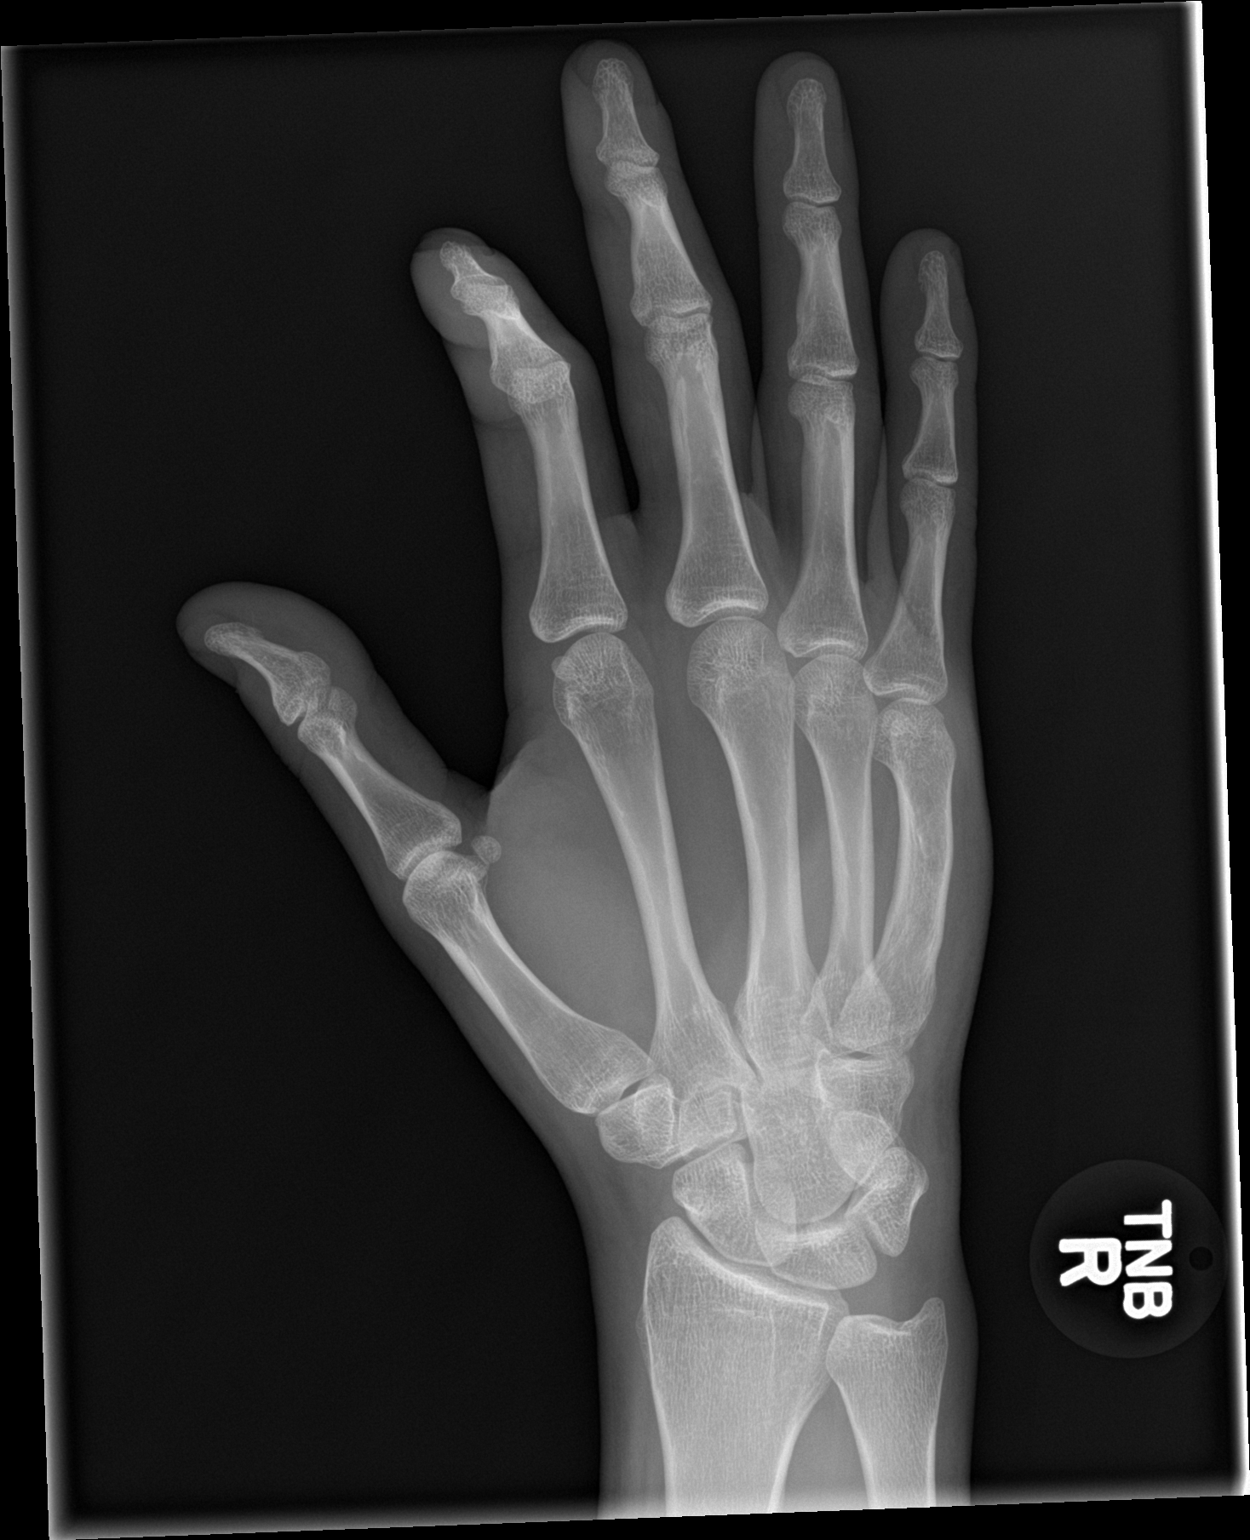

[hand lat]
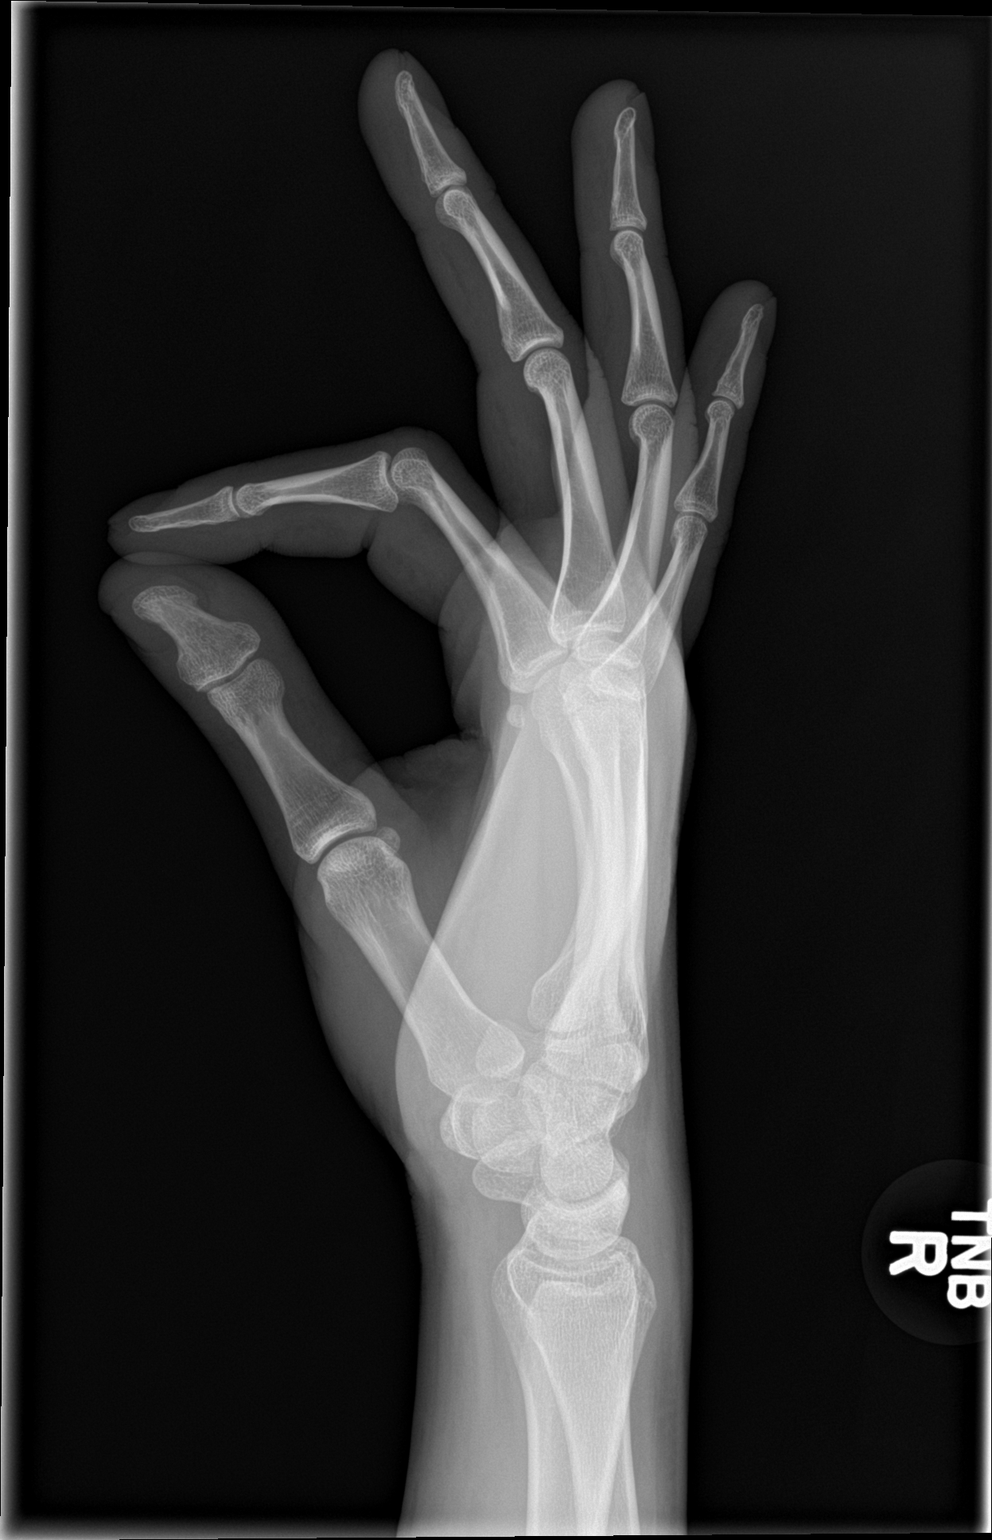

[3 of 3 positions shown; findings below may reference images not displayed]

FINDINGS: Osseous mineralization normal.

Joint spaces preserved.

Soft tissue swelling overlies the dorsum of the hand at the level of
the distal metacarpals and MCP joints.

No definite acute fracture, dislocation, or bone destruction.
IMPRESSION: No acute osseous abnormalities.

## 2019-08-26 ENCOUNTER — Inpatient Hospital Stay: Admit: 2019-08-26 | Discharge: 2019-08-26 | Payer: BLUE CROSS/BLUE SHIELD | Attending: Emergency Medicine

## 2019-08-26 DIAGNOSIS — B182 Chronic viral hepatitis C: Secondary | ICD-10-CM

## 2019-08-26 LAB — CBC WITH AUTOMATED DIFF
ABS. BASOPHILS: 0.1 10*3/uL (ref 0.0–0.2)
ABS. EOSINOPHILS: 0.3 10*3/uL (ref 0.0–0.7)
ABS. LYMPHOCYTES: 1.2 10*3/uL (ref 1.0–4.8)
ABS. MONOCYTES: 0.3 10*3/uL (ref 0.2–2.4)
ABS. NEUTROPHILS: 1.8 10*3/uL (ref 1.8–7.7)
ABSOLUTE NRBC: 0.02 10*3/uL
BASOPHILS: 2 % (ref 0.0–2.5)
EOSINOPHILS: 8 % — ABNORMAL HIGH (ref 0.9–2.9)
HCT: 43 % (ref 41–53)
HGB: 14.3 g/dL (ref 13.5–17.5)
LYMPHOCYTES: 32 % (ref 20.5–51.1)
MCH: 30.5 PG — ABNORMAL LOW (ref 31–34)
MCHC: 33.2 g/dL (ref 31.0–36.0)
MCV: 91.9 FL (ref 80–100)
MONOCYTES: 9 % (ref 1.7–9.3)
MPV: 8.3 FL (ref 6.5–11.5)
NEUTROPHILS: 49 % (ref 42–75)
NRBC: 0.5 PER 100 WBC
PLATELET: 218 10*3/uL (ref 150–400)
RBC: 4.67 M/uL (ref 4.50–5.90)
RDW: 12.9 % (ref 11.5–14.5)
WBC: 3.8 10*3/uL — ABNORMAL LOW (ref 4.4–11.3)

## 2019-08-26 LAB — PROTHROMBIN TIME + INR
INR: 1.1 (ref 0.9–1.1)
Prothrombin time: 10.6 s (ref 9.0–11.1)

## 2019-08-26 LAB — METABOLIC PANEL, COMPREHENSIVE
A-G Ratio: 0.9 — ABNORMAL LOW (ref 1.1–2.2)
ALT (SGPT): 808 U/L — ABNORMAL HIGH (ref 12–78)
AST (SGOT): 276 U/L — ABNORMAL HIGH (ref 15–37)
Albumin: 3.2 g/dL — ABNORMAL LOW (ref 3.5–5.0)
Alk. phosphatase: 89 U/L (ref 45–117)
Anion gap: 8 mmol/L (ref 5–15)
BUN/Creatinine ratio: 17 (ref 12–20)
BUN: 12 mg/dL (ref 6–20)
Bilirubin, total: 0.5 mg/dL (ref 0.2–1.0)
CO2: 29 mmol/L (ref 21–32)
Calcium: 8.9 mg/dL (ref 8.5–10.1)
Chloride: 104 mmol/L (ref 97–108)
Creatinine: 0.71 mg/dL (ref 0.70–1.30)
GFR est AA: >60 mL/min/{1.73_m2} (ref >60)
GFR est non-AA: >60 mL/min/{1.73_m2} (ref >60)
Globulin: 3.5 g/dL (ref 2.0–4.0)
Glucose: 96 mg/dL (ref 65–100)
Potassium: 4.3 mmol/L (ref 3.5–5.1)
Protein, total: 6.7 g/dL (ref 6.4–8.2)
Sodium: 141 mmol/L (ref 136–145)

## 2019-08-26 LAB — AMMONIA
Ammonia, plasma: 32 umol/L — ABNORMAL HIGH (ref <32)
Ammonia: 32 umol/L — ABNORMAL HIGH (ref <32)

## 2019-08-26 LAB — CBC WITH AUTO DIFFERENTIAL
Basophils %: 2 % (ref 0.0–2.5)
Basophils Absolute: 0.1 10*3/uL (ref 0.0–0.2)
Eosinophils %: 8 % — ABNORMAL HIGH (ref 0.9–2.9)
Eosinophils Absolute: 0.3 10*3/uL (ref 0.0–0.7)
Hematocrit: 43 % (ref 41–53)
Hemoglobin: 14.3 g/dL (ref 13.5–17.5)
Lymphocytes %: 32 % (ref 20.5–51.1)
Lymphocytes Absolute: 1.2 10*3/uL (ref 1.0–4.8)
MCH: 30.5 PG — ABNORMAL LOW (ref 31–34)
MCHC: 33.2 g/dL (ref 31.0–36.0)
MCV: 91.9 FL (ref 80–100)
MPV: 8.3 FL (ref 6.5–11.5)
Monocytes %: 9 % (ref 1.7–9.3)
Monocytes Absolute: 0.3 10*3/uL (ref 0.2–2.4)
NRBC Absolute: 0.02 10*3/uL
Neutrophils %: 49 % (ref 42–75)
Neutrophils Absolute: 1.8 10*3/uL (ref 1.8–7.7)
Nucleated RBCs: 0.5 PER 100 WBC
Platelets: 218 10*3/uL (ref 150–400)
RBC: 4.67 M/uL (ref 4.50–5.90)
RDW: 12.9 % (ref 11.5–14.5)
WBC: 3.8 10*3/uL — ABNORMAL LOW (ref 4.4–11.3)

## 2019-08-26 LAB — COMPREHENSIVE METABOLIC PANEL
ALT: 808 U/L — ABNORMAL HIGH (ref 12–78)
AST: 276 U/L — ABNORMAL HIGH (ref 15–37)
Albumin/Globulin Ratio: 0.9 — ABNORMAL LOW (ref 1.1–2.2)
Albumin: 3.2 g/dL — ABNORMAL LOW (ref 3.5–5.0)
Alkaline Phosphatase: 89 U/L (ref 45–117)
Anion Gap: 8 mmol/L (ref 5–15)
BUN: 12 mg/dL (ref 6–20)
Bun/Cre Ratio: 17 (ref 12–20)
CO2: 29 mmol/L (ref 21–32)
Calcium: 8.9 mg/dL (ref 8.5–10.1)
Chloride: 104 mmol/L (ref 97–108)
Creatinine: 0.71 mg/dL (ref 0.70–1.30)
EGFR IF NonAfrican American: >60 mL/min/{1.73_m2} (ref >60)
GFR African American: >60 mL/min/{1.73_m2} (ref >60)
Globulin: 3.5 g/dL (ref 2.0–4.0)
Glucose: 96 mg/dL (ref 65–100)
Potassium: 4.3 mmol/L (ref 3.5–5.1)
Sodium: 141 mmol/L (ref 136–145)
Total Bilirubin: 0.5 mg/dL (ref 0.2–1.0)
Total Protein: 6.7 g/dL (ref 6.4–8.2)

## 2019-08-26 LAB — PROTIME-INR
INR: 1.1 (ref 0.9–1.1)
Protime: 10.6 s (ref 9.0–11.1)

## 2019-08-26 NOTE — ED Provider Notes (Signed)
ED Provider Notes by Domingo Dimes, MD at 08/26/19 1207                Author: Domingo Dimes, MD  Service: Emergency Medicine  Author Type: Physician       Filed: 08/26/19 2018  Date of Service: 08/26/19 1207  Status: Signed          Editor: Domingo Dimes, MD (Physician)               EMERGENCY DEPARTMENT HISTORY AND PHYSICAL EXAM           Date: 08/26/2019   Patient Name: Randy Hoffman        History of Presenting Illness          Chief Complaint       Patient presents with        ?  Abnormal Lab Results             [facility] due to being diagnosed in 2019 with Hep C, AST and ALT elevated           History Provided By: Patient      HPI: Randy Hoffman,  29 y.o. male with a past medical history significant  Alcoholism and hepatitis C infection presents to the ED with [facility] of [facility] for elevated liver functions patient denies any abdominal pain no nausea no vomiting no diarrhea      There are no other complaints, changes, or physical findings at this time.      PCP: No primary care provider on file.        No current facility-administered medications on file prior to encounter.          No current outpatient medications on file prior to encounter.             Past History        Past Medical History:     Past Medical History:        Diagnosis  Date         ?  Liver disease            Hep C           Past Surgical History:   No past surgical history on file.      Family History:   No family history on file.      Social History:     Social History          Tobacco Use         ?  Smoking status:  Former Smoker       Substance Use Topics         ?  Alcohol use:  Not on file             Comment: drank daily none since 2019         ?  Drug use:  Yes              Types:  Marijuana, Methamphetamines, Cocaine           Allergies:   No Known Allergies           Review of Systems        Review of Systems    Constitutional: Negative for chills and fever.    HENT: Negative  for rhinorrhea and sore throat.     Eyes: Negative for pain and visual disturbance.    Respiratory: Negative for cough  and shortness of breath.     Cardiovascular: Negative for chest pain and leg swelling.    Gastrointestinal: Negative for abdominal pain and vomiting.    Endocrine: Negative for polydipsia and polyuria.    Genitourinary: Negative for dysuria and urgency.    Musculoskeletal: Negative for back pain and neck pain.    Skin: Negative for color change and wound.    Neurological: Negative for weakness and numbness.    Psychiatric/Behavioral: Negative.             Physical Exam        Physical Exam   Vitals and nursing note reviewed.   Constitutional:        General: He is not in acute distress.     Appearance: Normal appearance. He is not ill-appearing, toxic-appearing or diaphoretic.    HENT:       Head: Normocephalic and atraumatic.      Mouth/Throat:      Mouth: Mucous membranes are moist.      Pharynx: Oropharynx is clear.   Eyes:       General: No scleral icterus.     Extraocular Movements: Extraocular movements intact.      Conjunctiva/sclera: Conjunctivae normal.      Pupils: Pupils are equal, round, and reactive to light.    Cardiovascular:       Rate and Rhythm: Normal rate and regular rhythm.      Pulses: Normal pulses.      Heart sounds: Normal heart sounds.    Pulmonary:       Effort: Pulmonary effort is normal.      Breath sounds: Normal breath sounds.   Abdominal :      General: Bowel sounds are normal.      Palpations: Abdomen is soft.      Tenderness: There is no abdominal tenderness.     Musculoskeletal:          General: No deformity. Normal range of motion.      Cervical back: Normal range of motion and neck supple.    Skin:      General: Skin is warm.      Capillary Refill: Capillary refill takes less than 2 seconds.    Neurological:       General: No focal deficit present.      Mental Status: He is alert.    Psychiatric:         Mood and Affect: Mood normal.         Behavior: Behavior  normal.               Lab and Diagnostic Study Results        Labs -    No results found for this or any previous visit (from the past 12 hour(s)).      Radiologic Studies -    @lastxrresult @     CT Results   (Last 48 hours)          None                 CXR Results   (Last 48 hours)          None                       Medical Decision Making     - I am the first provider for this patient.      - I reviewed the vital signs, available nursing notes, past medical history,  past surgical history, family history and social history.      - Initial assessment performed. The patients presenting problems have been discussed, and they are in agreement with the care plan formulated and outlined with them.  I have encouraged them to ask questions as they arise throughout their visit.      Vital Signs-Reviewed the patient's vital signs.   Patient Vitals for the past 12 hrs:            Temp  Pulse  Resp  BP  SpO2            08/26/19 1154  98 ??F (36.7 ??C)  --  --  --  --            08/26/19 1150  --  76  20  139/79  99 %           Records Reviewed: Nursing Notes      The patient presents with elevated liver enzymes with a differential diagnosis of liver cirrhosis, hepatitis, carcinoma         ED Course:         ED Course as of Aug 25 2013       Tue Aug 26, 2019        1304  HGB: 14.3 [SB]     1304  HCT: 43.0 [SB]     1304  PLATELET: 218 [SB]     1304  Ammonia(!): 32 [SB]     1304  AST(!): 276 [SB]     1304  ALT(!): 808 [SB]     1304  Bilirubin, total: 0.5 [SB]              ED Course User Index   [SB] Domingo Dimes, MD           Provider Notes (Medical Decision Making):       MDM            Procedures     Medical Decision Makingedical Decision Making   Performed by: Domingo Dimes, MD   PROCEDURES:   Procedures            Disposition     Disposition: Condition stable and ongoing   DC- Adult Discharges: All of the diagnostic tests were reviewed and questions answered. Diagnosis, care plan and treatment options were  discussed.  The patient understands the instructions and will follow up as directed. The patients results have been  reviewed with them.  They have been counseled regarding their diagnosis.  The patient verbally convey understanding and agreement of the signs, symptoms, diagnosis, treatment and prognosis and additionally agrees to follow up as recommended with their  PCP in 24 - 48 hours.  They also agree with the care-plan and convey that all of their questions have been answered.  I have also put together some discharge instructions for them that include: 1) educational information regarding their diagnosis, 2)  how to care for their diagnosis at home, as well a 3) list of reasons why they would want to return to the ED prior to their follow-up appointment, should their condition change.            DISCHARGE PLAN:   1. There are no discharge medications for this patient.      2.      Follow-up Information      None             3.  Return to ED if worse  4. There are no discharge medications for this patient.              Diagnosis        Clinical Impression: No diagnosis found.      Attestations:      Doylene Bode, MD      Please note that this dictation was completed with Dragon, the computer voice recognition software.  Quite often unanticipated grammatical, syntax, homophones, and other interpretive errors are inadvertently  transcribed by the computer software.  Please disregard these errors.  Please excuse any errors that have escaped final proofreading.  Thank you.
